# Patient Record
Sex: Male | Born: 1935 | Race: White | Hispanic: No | Marital: Married | Smoking: Former smoker
Health system: Southern US, Community
[De-identification: ages and names within clinical notes are randomized; demographics above are authoritative.]

## PROBLEM LIST (undated history)

## (undated) DIAGNOSIS — I739 Peripheral vascular disease, unspecified: Secondary | ICD-10-CM

## (undated) DIAGNOSIS — J449 Chronic obstructive pulmonary disease, unspecified: Secondary | ICD-10-CM

## (undated) DIAGNOSIS — D649 Anemia, unspecified: Secondary | ICD-10-CM

## (undated) DIAGNOSIS — E785 Hyperlipidemia, unspecified: Secondary | ICD-10-CM

## (undated) DIAGNOSIS — F419 Anxiety disorder, unspecified: Secondary | ICD-10-CM

## (undated) DIAGNOSIS — C801 Malignant (primary) neoplasm, unspecified: Secondary | ICD-10-CM

## (undated) DIAGNOSIS — I10 Essential (primary) hypertension: Secondary | ICD-10-CM

## (undated) DIAGNOSIS — IMO0002 Reserved for concepts with insufficient information to code with codable children: Secondary | ICD-10-CM

## (undated) HISTORY — DX: Essential (primary) hypertension: I10

## (undated) HISTORY — DX: Chronic obstructive pulmonary disease, unspecified: J44.9

## (undated) HISTORY — DX: Reserved for concepts with insufficient information to code with codable children: IMO0002

## (undated) HISTORY — DX: Anemia, unspecified: D64.9

## (undated) HISTORY — DX: Peripheral vascular disease, unspecified: I73.9

## (undated) HISTORY — DX: Malignant (primary) neoplasm, unspecified: C80.1

## (undated) HISTORY — DX: Anxiety disorder, unspecified: F41.9

## (undated) HISTORY — DX: Hyperlipidemia, unspecified: E78.5

---

## 2009-07-04 DIAGNOSIS — C801 Malignant (primary) neoplasm, unspecified: Secondary | ICD-10-CM

## 2009-07-04 HISTORY — DX: Malignant (primary) neoplasm, unspecified: C80.1

## 2009-10-07 ENCOUNTER — Ambulatory Visit: Payer: Self-pay | Admitting: Family Medicine

## 2009-10-07 DIAGNOSIS — J449 Chronic obstructive pulmonary disease, unspecified: Secondary | ICD-10-CM

## 2009-10-07 DIAGNOSIS — I10 Essential (primary) hypertension: Secondary | ICD-10-CM

## 2009-10-07 DIAGNOSIS — J4489 Other specified chronic obstructive pulmonary disease: Secondary | ICD-10-CM | POA: Insufficient documentation

## 2009-10-07 DIAGNOSIS — F329 Major depressive disorder, single episode, unspecified: Secondary | ICD-10-CM

## 2009-10-07 DIAGNOSIS — I739 Peripheral vascular disease, unspecified: Secondary | ICD-10-CM

## 2009-10-07 DIAGNOSIS — K279 Peptic ulcer, site unspecified, unspecified as acute or chronic, without hemorrhage or perforation: Secondary | ICD-10-CM | POA: Insufficient documentation

## 2009-10-07 DIAGNOSIS — E7800 Pure hypercholesterolemia, unspecified: Secondary | ICD-10-CM

## 2009-10-10 ENCOUNTER — Encounter: Payer: Self-pay | Admitting: Family Medicine

## 2009-10-14 ENCOUNTER — Encounter: Payer: Self-pay | Admitting: Family Medicine

## 2009-10-15 ENCOUNTER — Telehealth (INDEPENDENT_AMBULATORY_CARE_PROVIDER_SITE_OTHER): Payer: Self-pay | Admitting: *Deleted

## 2009-10-16 ENCOUNTER — Telehealth: Payer: Self-pay | Admitting: Family Medicine

## 2009-10-16 ENCOUNTER — Ambulatory Visit: Payer: Self-pay | Admitting: Family Medicine

## 2009-10-16 ENCOUNTER — Telehealth (INDEPENDENT_AMBULATORY_CARE_PROVIDER_SITE_OTHER): Payer: Self-pay | Admitting: *Deleted

## 2009-10-16 DIAGNOSIS — R0989 Other specified symptoms and signs involving the circulatory and respiratory systems: Secondary | ICD-10-CM

## 2009-10-16 DIAGNOSIS — R05 Cough: Secondary | ICD-10-CM

## 2009-10-16 DIAGNOSIS — R0609 Other forms of dyspnea: Secondary | ICD-10-CM

## 2009-10-18 ENCOUNTER — Encounter: Payer: Self-pay | Admitting: Family Medicine

## 2009-10-21 ENCOUNTER — Ambulatory Visit: Payer: Self-pay | Admitting: Family Medicine

## 2009-10-21 DIAGNOSIS — D509 Iron deficiency anemia, unspecified: Secondary | ICD-10-CM

## 2009-10-21 LAB — CONVERTED CEMR LAB: Hemoglobin: 10.9 g/dL

## 2009-10-26 ENCOUNTER — Telehealth (INDEPENDENT_AMBULATORY_CARE_PROVIDER_SITE_OTHER): Payer: Self-pay | Admitting: *Deleted

## 2009-10-27 ENCOUNTER — Ambulatory Visit: Payer: Self-pay | Admitting: Family Medicine

## 2009-11-11 ENCOUNTER — Encounter: Payer: Self-pay | Admitting: Family Medicine

## 2009-11-20 ENCOUNTER — Ambulatory Visit: Payer: Self-pay | Admitting: Family Medicine

## 2009-11-20 DIAGNOSIS — E1159 Type 2 diabetes mellitus with other circulatory complications: Secondary | ICD-10-CM | POA: Insufficient documentation

## 2009-11-20 DIAGNOSIS — Z87891 Personal history of nicotine dependence: Secondary | ICD-10-CM | POA: Insufficient documentation

## 2009-11-27 ENCOUNTER — Telehealth (INDEPENDENT_AMBULATORY_CARE_PROVIDER_SITE_OTHER): Payer: Self-pay | Admitting: *Deleted

## 2009-11-27 ENCOUNTER — Encounter: Payer: Self-pay | Admitting: Family Medicine

## 2009-11-27 ENCOUNTER — Encounter: Admission: RE | Admit: 2009-11-27 | Discharge: 2009-11-27 | Payer: Self-pay | Admitting: Family Medicine

## 2009-12-01 ENCOUNTER — Encounter: Admission: RE | Admit: 2009-12-01 | Discharge: 2009-12-01 | Payer: Self-pay | Admitting: Family Medicine

## 2009-12-01 DIAGNOSIS — C3490 Malignant neoplasm of unspecified part of unspecified bronchus or lung: Secondary | ICD-10-CM | POA: Insufficient documentation

## 2009-12-01 LAB — CONVERTED CEMR LAB: Creatinine, Ser: 1.74 mg/dL — ABNORMAL HIGH (ref 0.40–1.50)

## 2009-12-03 ENCOUNTER — Encounter: Payer: Self-pay | Admitting: Family Medicine

## 2009-12-15 ENCOUNTER — Encounter: Payer: Self-pay | Admitting: Family Medicine

## 2009-12-16 ENCOUNTER — Encounter: Payer: Self-pay | Admitting: Family Medicine

## 2009-12-23 ENCOUNTER — Ambulatory Visit: Payer: Self-pay | Admitting: Family Medicine

## 2009-12-24 ENCOUNTER — Encounter: Payer: Self-pay | Admitting: Family Medicine

## 2010-01-05 ENCOUNTER — Encounter: Payer: Self-pay | Admitting: Family Medicine

## 2010-02-01 HISTORY — PX: THORACOTOMY: SUR1349

## 2010-02-05 ENCOUNTER — Telehealth: Payer: Self-pay | Admitting: Family Medicine

## 2010-02-17 ENCOUNTER — Telehealth (INDEPENDENT_AMBULATORY_CARE_PROVIDER_SITE_OTHER): Payer: Self-pay | Admitting: *Deleted

## 2010-02-18 ENCOUNTER — Encounter: Admission: RE | Admit: 2010-02-18 | Discharge: 2010-02-18 | Payer: Self-pay | Admitting: Thoracic Diseases

## 2010-02-19 ENCOUNTER — Telehealth (INDEPENDENT_AMBULATORY_CARE_PROVIDER_SITE_OTHER): Payer: Self-pay | Admitting: *Deleted

## 2010-02-22 ENCOUNTER — Telehealth (INDEPENDENT_AMBULATORY_CARE_PROVIDER_SITE_OTHER): Payer: Self-pay | Admitting: *Deleted

## 2010-02-23 ENCOUNTER — Encounter: Payer: Self-pay | Admitting: Family Medicine

## 2010-02-24 ENCOUNTER — Encounter: Payer: Self-pay | Admitting: Family Medicine

## 2010-02-25 ENCOUNTER — Ambulatory Visit: Payer: Self-pay | Admitting: Family Medicine

## 2010-02-25 ENCOUNTER — Telehealth: Payer: Self-pay | Admitting: Family Medicine

## 2010-03-04 ENCOUNTER — Encounter: Payer: Self-pay | Admitting: Family Medicine

## 2010-03-26 ENCOUNTER — Ambulatory Visit: Payer: Self-pay | Admitting: Family Medicine

## 2010-03-26 ENCOUNTER — Telehealth: Payer: Self-pay | Admitting: Family Medicine

## 2010-04-05 ENCOUNTER — Telehealth: Payer: Self-pay | Admitting: Family Medicine

## 2010-04-08 ENCOUNTER — Telehealth: Payer: Self-pay | Admitting: Family Medicine

## 2010-04-27 ENCOUNTER — Ambulatory Visit: Payer: Self-pay | Admitting: Family Medicine

## 2010-04-27 LAB — CONVERTED CEMR LAB: Hemoglobin: 5.8 g/dL

## 2010-04-28 ENCOUNTER — Encounter: Payer: Self-pay | Admitting: Family Medicine

## 2010-05-06 ENCOUNTER — Encounter: Payer: Self-pay | Admitting: Family Medicine

## 2010-05-14 ENCOUNTER — Ambulatory Visit: Payer: Self-pay | Admitting: Family Medicine

## 2010-05-18 ENCOUNTER — Telehealth: Payer: Self-pay | Admitting: Family Medicine

## 2010-05-24 ENCOUNTER — Encounter: Payer: Self-pay | Admitting: Family Medicine

## 2010-06-03 ENCOUNTER — Telehealth (INDEPENDENT_AMBULATORY_CARE_PROVIDER_SITE_OTHER): Payer: Self-pay | Admitting: *Deleted

## 2010-06-04 ENCOUNTER — Encounter: Payer: Self-pay | Admitting: Family Medicine

## 2010-06-10 ENCOUNTER — Telehealth: Payer: Self-pay | Admitting: Family Medicine

## 2010-06-18 ENCOUNTER — Encounter: Payer: Self-pay | Admitting: Family Medicine

## 2010-07-15 ENCOUNTER — Encounter: Payer: Self-pay | Admitting: Family Medicine

## 2010-07-29 ENCOUNTER — Encounter: Payer: Self-pay | Admitting: Family Medicine

## 2010-08-03 NOTE — Progress Notes (Signed)
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 He will need an appt with [facility] to follow.  Initial call taken by: Seymour Bars DO,  October 26, 2009 1:30 PM  Follow-up for Phone Call        Pt aware Follow-up by: Payton Spark CMA,  October 27, 2009 8:25 AM

## 2010-08-03 NOTE — Assessment & Plan Note (Signed)
Summary: f/u mood/ COPD/ lung CA   Vital Signs:  Patient profile:   75 year old male Height:      69 inches Weight:      140 pounds BMI:     20.75 O2 Sat:      95 % on Room air Pulse rate:   96 / minute BP sitting:   124 / 57  (left arm) Cuff size:   regular  Vitals Entered By: Payton Spark CMA (March 26, 2010 8:33 AM)  O2 Flow:  Room air CC: F/U.   Primary Care Provider:  Seymour Bars DO  CC:  F/U.Marland Kitchen  History of Present Illness: 74 yo WM presents for f/u visit.  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  He developed complete R lung atx and was seen by Dr Eulah Pont in the hospital and had a bronchoscopy done.  He is wearing oxygen at night.  Continues to have low energy level and some chest contgestion.  Denies wt loss, anorexia, fevers or nightsweats.  He is using duonebs as needed.   He is taking Amitripytline at night but still not sleeping well.  Anxiety has improved but is still needing Xanax almost eveyrday.    Current Medications (verified): 1)  Simvastatin 20 Mg Tabs (Simvastatin) .... Take 1 Tab By Mouth At Bedtime 2)  Omeprazole 20 Mg Cpdr (Omeprazole) .... Take 1 Tab By Mouth Once Daily 3)  Amlodipine Besylate 10 Mg Tabs (Amlodipine Besylate) .... Take 1 Tab By Mouth Once Daily 4)  Doxazosin Mesylate 4 Mg Tabs (Doxazosin Mesylate) .... Take 2 Tabs By Mouth Once Daily 5)  Hydrochlorothiazide 25 Mg Tabs (Hydrochlorothiazide) .... Take 1/2  Tab By Mouth Once Daily 6)  Amitriptyline Hcl 75 Mg Tabs (Amitriptyline Hcl) .... Take 1 Tablet By Mouth Once A Day At Bedtime 7)  Alprazolam 0.5 Mg Tabs (Alprazolam) .... Take 1 Tablet By Mouth Two Times A Day 8)  Proair Hfa 108 (90 Base) Mcg/act Aers (Albuterol Sulfate) .... 2 Puffs 4 X A Day As Needed For Shortness of Breath 9)  Freestyle Test  Strp (Glucose Blood) .... Use As Directed To Test Blood Sugar Once Daily 10)  Humidified Oxygen Via Nasal  [place] .Marland Kitchen.. 2l/min Dx; Copd With Nocturnal Hypoxemia 11)  Nebulizer Machine .... Dx: Copd Send To [facility] 12)  Duoneb 0.5-2.5 (3) Mg/51ml Soln (Ipratropium-Albuterol) .... 3 Ml Nebs 4 X A Day  Allergies (verified): No Known Drug Allergies  Past History:  Past Medical History: anxiety HTN DM High cholesterol  PUD COPD, on home O2 severe iron def anemia PAD- [facility] Stage IA RUL squamous cell carcinoma  GI Dr Jason Fila CVTS Dr Lequita Halt pulm Dr Ether Griffins  Past Surgical History: EGD 2010/ colonoscopy 2010 --> mutiple small gastric ulcers R thoracotomy/ RUL resection for lung cancer 02-2010  Family History: Reviewed history from 10/07/2009 and no changes required. mother died of abdomina aneursym father died cancer brother died lung cancer 2 sisters healthy  Social History: Reviewed history from 10/07/2009 and no changes required. Retired from [employer]. Married.  Has 3 daughters all local. Smokes 1 ppd Walks some. Wt stable. Denies ETOH.  Review of Systems      See HPI  Physical Exam  General:  alert, well-developed, well-nourished, and well-hydrated.   Head:  normocephalic and atraumatic.   Eyes:  conjunctiva clear Mouth:  pharynx pink and moist.   Neck:  no masses.  Chest Wall:  healed surgical scar R chest wall Lungs:  nonlabored breathing with bibasilar wheezing and mild rhonchi.  prolonged exp phase.  barrel chested Heart:  RRR w/o audible murmurs Abdomen:  soft, non-tender, normal bowel sounds, no distention, and no masses.   Skin:  color normal.   Psych:  good eye contact, not anxious appearing, and not depressed appearing.     Impression & Recommendations:  Problem # 1:  MALIGNANT NEOPLASM BRONCHUS&LUNG UNSPEC SITE (ICD-162.9) Reviewed records.  He had RUL resection of stage IA squamous cell carcinoma and is being followed by Dr Lequita Halt and Dr Ether Griffins.  It apperas that he is not needing to do any further treament at this time.    His spirits have improved  but he is still needing xanax (day ) and restoril at night.  Problem # 2:  DEPRESSION (ICD-311) Mood has improved on current meds.  Continue.  His updated medication list for this problem includes:    Amitriptyline Hcl 75 Mg Tabs (Amitriptyline hcl) .Marland Kitchen... Take 1 tablet by mouth once a day at bedtime    Alprazolam 0.5 Mg Tabs (Alprazolam) .Marland Kitchen... 1/2 to 1 tab by mouth daily as needed for anxiety  Problem # 3:  COPD (ICD-496) Following with Dr Ether Griffins.  On meds.  Declined flu vaccine today.  Still working on smoking cessation.  On nocturnal O2 His updated medication list for this problem includes:    Proair Hfa 108 (90 Base) Mcg/act Aers (Albuterol sulfate) .Marland Kitchen... 2 puffs 4 x a day as needed for shortness of breath    Duoneb 0.5-2.5 (3) Mg/20ml Soln (Ipratropium-albuterol) .Marland KitchenMarland KitchenMarland KitchenMarland Kitchen 3 ml nebs 4 x a day  Problem # 4:  ESSENTIAL HYPERTENSION, BENIGN (ICD-401.1) Stop HCTZ due to dry mucous membranes and elevated CR.  .  Will continue other meds.  F/U in 2 mos and will update labs then.   The following medications were removed from the medication list:    Hydrochlorothiazide 25 Mg Tabs (Hydrochlorothiazide) .Marland Kitchen... Take 1/2  tab by mouth once daily His updated medication list for this problem includes:    Amlodipine Besylate 10 Mg Tabs (Amlodipine besylate) .Marland Kitchen... Take 1 tab by mouth once daily    Doxazosin Mesylate 4 Mg Tabs (Doxazosin mesylate) .Marland Kitchen... Take 2 tabs by mouth once daily  BP today: 124/57 Prior BP: 130/60 (02/25/2010)  Labs Reviewed: Creat: : 1.74 (11/27/2009)     Complete Medication List: 1)  Simvastatin 20 Mg Tabs (Simvastatin) .... Take 1 tab by mouth at bedtime 2)  Omeprazole 20 Mg Cpdr (Omeprazole) .... Take 1 tab by mouth once daily 3)  Amlodipine Besylate 10 Mg Tabs (Amlodipine besylate) .... Take 1 tab by mouth once daily 4)  Doxazosin Mesylate 4 Mg Tabs (Doxazosin mesylate) .... Take 2 tabs by mouth once daily 5)  Amitriptyline Hcl 75 Mg Tabs (Amitriptyline hcl) .... Take 1  tablet by mouth once a day at bedtime 6)  Restoril 15 Mg Caps (Temazepam) .Marland Kitchen.. 1 capsule by mouth at bedtime as needed sleep 7)  Proair Hfa 108 (90 Base) Mcg/act Aers (Albuterol sulfate) .... 2 puffs 4 x a day as needed for shortness of breath 8)  Freestyle Test Strp (Glucose blood) .... Use as directed to test blood sugar once daily 9)  Humidified Oxygen Via Nasal [place]  .Marland Kitchen.. 2l/min dx; copd with nocturnal hypoxemia 10)  Nebulizer Machine  .... Dx: copd send to [facility] 11)  Duoneb 0.5-2.5 (3) Mg/70ml Soln (Ipratropium-albuterol) .... 3 ml nebs 4 x a day  12)  Alprazolam 0.5 Mg Tabs (Alprazolam) .... 1/2 to 1 tab by mouth daily as needed for anxiety  Patient Instructions: 1)  Stop HCTZ. 2)  Will get [facility] to come out and check your Oxygen and Nebulized medicine at home. 3)  Will change Alprazolam to Restoril for sleep at nighttime. 4)  Stay on Amitriptyline at night. 5)  Recommend flu shot in October. 6)  rEturn for f/u with labs in 2 mos. Prescriptions: ALPRAZOLAM 0.5 MG TABS (ALPRAZOLAM) 1/2 to 1 tab by mouth daily as needed for anxiety  #30 x 0   Entered and Authorized by:   Seymour Bars DO   Signed by:   Seymour Bars DO on 03/26/2010   Method used:   Printed then faxed to ...       [facility] (retail)       [address]       [place], [place]  [address]       Ph: [phone]       Fax: [phone]   RxID:   [id number] RESTORIL 15 MG CAPS (TEMAZEPAM) 1 capsule by mouth at bedtime as needed sleep  #30 x 0   Entered and Authorized by:   Seymour Bars DO   Signed by:   Seymour Bars DO on 03/26/2010   Method used:   Printed then faxed to ...       [facility] (retail)       [address]       [place], [place]  [address]       Ph: [phone]       Fax: [phone]   RxID:   [id number]

## 2010-08-03 NOTE — Letter (Signed)
Summary: [facility]  [facility]   Imported By: Lanelle Bal 05/07/2010 10:37:57  _____________________________________________________________________  External Attachment:    Type:   Image     Comment:   External Document

## 2010-08-03 NOTE — Progress Notes (Signed)
Summary: HH  Phone Note Call from Patient Call back at [phone]   Caller: Patient Call For: Zachary Bars DO Summary of Call: Pt called and wanted update on [facility] nurse coming out because no one has came out yet and what to do about his soreness Initial call taken by: Kathlene November LPN,  April 08, 2010 8:09 AM  Follow-up for Phone Call        Dr. Cathey Endow, I will check on home health. What do you advise for Pt's chest soreness.   Is he talking about incisional soreness? Follow-up by: Payton Spark CMA,  April 08, 2010 8:15 AM  Additional Follow-up for Phone Call Additional follow up Details #1::        Ref # [id number] for [facility]. Someone from local office will call back shortly. Additional Follow-up by: Payton Spark CMA,  April 08, 2010 8:47 AM    Additional Follow-up for Phone Call Additional follow up Details #2::    Pt states he has been sore where his incision is and also in his shoulder. Pt states that these areas have been sore since the surgery.  Follow-up by: Payton Spark CMA,  April 08, 2010 4:51 PM  Additional Follow-up for Phone Call Additional follow up Details #3:: Details for Additional Follow-up Action Taken: does he have anything for pain?  Christoper Allegra went out to home on 04/07/10. Pt's machines and supplies are setup. I also called [facility] to verify pain med Rx. Pt has only filled Norco 5-325 Take 1 tab by mouth q 4-6 hours as needed #50 x 0 in August.  Additional Follow-up by: Zachary Bars DO,  April 08, 2010 4:54 PM  New/Updated Medications: HYDROCODONE-ACETAMINOPHEN 5-325 MG TABS (HYDROCODONE-ACETAMINOPHEN) 1-2 tabs by mouth three times a day as needed severe pain Prescriptions: HYDROCODONE-ACETAMINOPHEN 5-325 MG TABS (HYDROCODONE-ACETAMINOPHEN) 1-2 tabs by mouth three times a day as needed severe pain  #40 x 0   Entered and Authorized by:   Zachary Bars DO   Signed by:   Zachary Bars DO on 04/09/2010   Method used:   Printed then faxed to ...       [facility]  (retail)       [address]       [place], [place]  [address]       Ph: [phone]       Fax: [phone]   RxID:   [id number]   Appended Document: HH Pt aware of the above.

## 2010-08-03 NOTE — Progress Notes (Signed)
Summary: No BM  Phone Note From Other Clinic   Initial call taken by: Payton Spark CMA,  February 19, 2010 11:18 AM Caller: Joni Reining [facility] Summary of Call: Joni Reining visited Pt today and reports that Pt has still not had BM. Pt is not uncomfortable and has positive bowel sounds. Pt is taking Miralax two times a day. Joni Reining plans to visit Pt again next week unless you have other plans. Please advise. Initial call taken by: Payton Spark CMA,  February 19, 2010 11:19 AM  Follow-up for Phone Call        Can Joni Reining add Colace 2 x a day if he is not already on a stool softener and ensure that his is getting adequate intake of fluids.  Call back with a report on Monday.  Thanks. Follow-up by: Seymour Bars DO,  February 19, 2010 11:36 AM  Additional Follow-up for Phone Call Additional follow up Details #1::        [facility] for Joni Reining to [facility] Additional Follow-up by: Payton Spark CMA,  February 19, 2010 11:43 AM    Additional Follow-up for Phone Call Additional follow up Details #2::    Joni Reining agrees to the above Follow-up by: Payton Spark CMA,  February 19, 2010 12:10 PM

## 2010-08-03 NOTE — Progress Notes (Signed)
Summary: HH  Phone Note Call from Patient Call back at Home Phone [phone] Call back at [phone]   Caller: Sister- Kathie Rhodes Call For: Zachary Bars DO Summary of Call: Called and states you told him you were gonna get a home health nurse to come to his home and he has not heard anything about it or had anyone come out. Also wants to know if he can get something for soreness where he had surgery done to remove part of his lung 2 months ago. Initial call taken by: Kathlene November LPN,  April 05, 2010 3:47 PM  Follow-up for Phone Call        Pt was DC'd from [facility] and will need new referral. We can have [facility] go see him. If you add order, I will fax. Follow-up by: Payton Spark CMA,  April 05, 2010 4:06 PM     Appended Document: [facility] order faxed to [facility]

## 2010-08-03 NOTE — Progress Notes (Signed)
Summary: [facility]: start O2  Phone Note Outgoing Call   Summary of Call: Pls call [facility] back.   It looks like he qualifies for nocturnal oxygen.  I will write RX but they will have to send me the form to complete. Initial call taken by: Seymour Bars DO,  October 15, 2009 12:21 PM  Follow-up for Phone Call        Rx faxed Follow-up by: Payton Spark CMA,  October 15, 2009 4:15 PM    New/Updated Medications: * HUMIDIFIED OXYGEN VIA NASAL CANULA 2L/MIN dx; COPD with nocturnal hypoxemia Prescriptions: HUMIDIFIED OXYGEN VIA NASAL CANULA 2L/MIN dx; COPD with nocturnal hypoxemia  #1 mo x 12   Entered and Authorized by:   Seymour Bars DO   Signed by:   Seymour Bars DO on 10/15/2009   Method used:   Printed then faxed to ...       [facility] (retail)       [address]       [place], [place]  [address]       Ph: [phone]       Fax: [phone]   RxID:   [id number]

## 2010-08-03 NOTE — Consult Note (Signed)
Summary: [facility]  [facility]   Imported By: Lanelle Bal 11/26/2009 13:06:29  _____________________________________________________________________  External Attachment:    Type:   Image     Comment:   External Document

## 2010-08-03 NOTE — Progress Notes (Signed)
Summary: [facility]  Phone Note From Other Clinic   Caller: Patient Initial call taken by: Payton Spark CMA,  February 17, 2010 12:41 PM Caller: [facility]  Summary of Call: Cherri from [facility] visited Pt today. Pt has not had BM in 6-8 days. Pt states this is not uncommon but Cherri advised Pt to restart Miralax, increase fiber and drink plenty of fluids. Cherri will go back to see Pt on this Fri and report back to [place] on BM. Pt also not wearing O2 all day so Cherri advised Pt that he should be on O2 all day. Pt agreed. Cherri will fax any orders that need to be signed.  Initial call taken by: Payton Spark CMA,  February 17, 2010 12:46 PM  Follow-up for Phone Call        ok, sounds like a good plan. Follow-up by: Seymour Bars DO,  February 17, 2010 1:28 PM

## 2010-08-03 NOTE — Assessment & Plan Note (Signed)
Summary: NOV COPD/ claudication   Vital Signs:  Patient profile:   75 year old male Height:      69 inches Weight:      153 pounds BMI:     22.68 O2 Sat:      97 % on Room air Temp:     98.7 degrees F oral Pulse rate:   93 / minute BP sitting:   111 / 50  (left arm) Cuff size:   regular  Vitals Entered By: Payton Spark CMA (October 07, 2009 9:44 AM)  O2 Flow:  Room air CC: New to est. C/o DOE and R glut pain w/ exertion.    Primary Care Provider:  Seymour Bars DO  CC:  New to est. C/o DOE and R glut pain w/ exertion. Marland Kitchen  History of Present Illness: 75 yo WM presents for NOV.    Due to insurance change, he had to change practices.  He has T2DM, HTN, high cholesterol, anxiety.  He is doing well with his medicines.    He is a long time smoker who has not been tested for COPD but c/o feeling more SOB w/ exertion.  Having to rest more and this bothers him b/c he tries to stay active.  He denies chest tightness.  Has some mild chronic cough.  He also has R buttock pain after walking for about 3 min.  It improves once he rests.  This started a few wks ago.  He has no tingling, numbness, pain pain or radiation of the pain.    Current Medications (verified): 1)  Metformin Hcl 500 Mg Tabs (Metformin Hcl) .... Take 1 Tab By Mouth Two Times A Day 2)  Simvastatin 20 Mg Tabs (Simvastatin) .... Take 1 Tab By Mouth At Bedtime 3)  Omeprazole 20 Mg Cpdr (Omeprazole) .... Take 1 Tab By Mouth Once Daily 4)  Amlodipine Besylate 10 Mg Tabs (Amlodipine Besylate) .... Take 1 Tab By Mouth Once Daily 5)  Doxazosin Mesylate 4 Mg Tabs (Doxazosin Mesylate) .... Take 2 Tabs By Mouth Once Daily 6)  Hydrochlorothiazide 25 Mg Tabs (Hydrochlorothiazide) .... Take 1 Tab By Mouth Once Daily 7)  Amitriptyline Hcl 25 Mg Tabs (Amitriptyline Hcl) .... Take 2 Tabs By Mouth At Bedtime 8)  Alprazolam 0.25 Mg Tabs (Alprazolam) .... Take 1 Tab By Mouth Once Daily As Needed  Allergies (verified): No Known Drug  Allergies  Past History:  Past Medical History: anxiety HTN DM High cholesterol  PUD  Past Surgical History: EGD 2010/ colonoscopy 11/25/2008  Family History: mother died of abdomina aneursym father died cancer brother died lung cancer 2 sisters healthy  Social History: Retired from [employer] certified [employer]. Married.  Has 3 daughters all local. Smokes 1 ppd Walks some. Wt stable. Denies ETOH.  Review of Systems       no fevers/sweats/weakness, unexplained wt loss/gain, no change in vision, no difficulty hearing, ringing in ears, no hay fever/allergies, no CP/discomfort, no palpitations, no breast lump/nipple discharge, + cough/wheeze, no blood in stool, no N/V/D, no nocturia, no leaking urine, no unusual vag bleeding, no vaginal/penile discharge, + muscle/joint pain, no rash, no new/changing mole, no HA, no memory loss, + anxiety, + sleep problem, no depression, no unexplained lumps, no easy bruising/bleeding, no concern with sexual function   Physical Exam  General:  elderly WM in NAD, hard of hearing Head:  normocephalic, atraumatic, and male-pattern balding.   Eyes:  clouding of the anterior chambers Ears:  no external deformities.   Nose:  no nasal discharge.   Mouth:  pharynx pink and moist and teeth missing.   Neck:  no masses.  no audible carotid bruits Chest Wall:  barrel chested Lungs:  prolonge exp phase, mildly labored with ambulation.  audible wheezing.  no rhonchi or crackles Heart:  RRR w/ no audible murmurs Abdomen:  no AA bruits, soft.  NABS.  No HSM R> L femoral bruit, harsh Pulses:  1+ pedal pulses Extremities:  no LE edema thickened angulated great toenails Skin:  color normal.   Cervical Nodes:  No lymphadenopathy noted Psych:  good eye contact, not anxious appearing, and not depressed appearing.     Impression & Recommendations:  Problem # 1:  CLAUDICATION, INTERMITTENT (ICD-443.9) R femoral bruit with R gluteal claudication.  PAD screening ordered.  Risk  factors include smoking, DM, high chol and HTN.   Orders: T-Arterial w/Exercise ([address])  Problem # 2:  COPD (ICD-496) He clearly has COPD but has not had PFTS done-- set up in 2 wks.  PFs in red zone so ProAir sample given to start 3-4 x a day.  Instructed on use.  Plan to set up DuoNebs and may need home O2 -- will get [facility] to go out and do any oxygen check.  Encouraged smoking cessation.  He had AMS from [facility]. His updated medication list for this problem includes:    Proair Hfa 108 (90 Base) Mcg/act Aers (Albuterol sulfate) .Marland Kitchen... 2 puffs 4 x a day as needed for shortness of breath  Orders: Home Health Referral (Home Health) Peak Flow Rate ([address])  Problem # 3:  ESSENTIAL HYPERTENSION, BENIGN (ICD-401.1) DBP a bit low today.  Plan to cut his HCTZ in 1/2 and f/u reading at 2 wk f/u visit. His updated medication list for this problem includes:    Amlodipine Besylate 10 Mg Tabs (Amlodipine besylate) .Marland Kitchen... Take 1 tab by mouth once daily    Doxazosin Mesylate 4 Mg Tabs (Doxazosin mesylate) .Marland Kitchen... Take 2 tabs by mouth once daily    Hydrochlorothiazide 25 Mg Tabs (Hydrochlorothiazide) .Marland Kitchen... Take 1/2  tab by mouth once daily  BP today: 111/50  Problem # 4:  HYPERCHOLESTEROLEMIA (ICD-272.0) Obtain old records for labs. His updated medication list for this problem includes:    Simvastatin 20 Mg Tabs (Simvastatin) .Marland Kitchen... Take 1 tab by mouth at bedtime  Complete Medication List: 1)  Metformin Hcl 500 Mg Tabs (Metformin hcl) .... Take 1 tab by mouth two times a day 2)  Simvastatin 20 Mg Tabs (Simvastatin) .... Take 1 tab by mouth at bedtime 3)  Omeprazole 20 Mg Cpdr (Omeprazole) .... Take 1 tab by mouth once daily 4)  Amlodipine Besylate 10 Mg Tabs (Amlodipine besylate) .... Take 1 tab by mouth once daily 5)  Doxazosin Mesylate 4 Mg Tabs (Doxazosin mesylate) .... Take 2 tabs by mouth once daily 6)  Hydrochlorothiazide 25 Mg Tabs (Hydrochlorothiazide) .... Take 1/2  tab by mouth once daily 7)   Amitriptyline Hcl 25 Mg Tabs (Amitriptyline hcl) .... Take 2 tabs by mouth at bedtime 8)  Alprazolam 0.25 Mg Tabs (Alprazolam) .... Take 1 tab by mouth once daily as needed 9)  Proair Hfa 108 (90 Base) Mcg/act Aers (Albuterol sulfate) .... 2 puffs 4 x a day as needed for shortness of breath 10)  Freestyle Test Strp (Glucose blood) .... Use as directed to test blood sugar once daily  Patient Instructions: 1)  Will set you up for arterial testing.   2)  Will set you up for  Oxygen testing -- Christoper Allegra will come out to your house to check this. 3)  Start on ProAir inhaler 2 puffs 3-4 x a day for shortness of breath. 4)  Cut back on smoking. 5)  Return for f/u with fasting labs in 6 wks.

## 2010-08-03 NOTE — Progress Notes (Signed)
Summary: Restoril refill  Phone Note Call from Patient   Caller: [facility] Call For: [facility] Summary of Call: Out of Restoril needs a refill sent to [facility] Initial call taken by: Kathlene November LPN,  June 03, 2010 10:49 AM  Follow-up for Phone Call        already sent yest Follow-up by: Payton Spark CMA,  June 03, 2010 11:50 AM

## 2010-08-03 NOTE — Progress Notes (Signed)
Summary: alprazolam RFd  Phone Note Call from Patient   Caller: Sister Summary of Call: Pt's sister [facility] stating Pt had surgery and is now very anxious and his "nerves are bad". Pt asked sister to call and request soemthing to help him relax and be less nervous. Please advise. Initial call taken by: Payton Spark CMA,  February 05, 2010 8:51 AM  Follow-up for Phone Call        have him schedule a f/u with me in 3-4 wks. Follow-up by: Seymour Bars DO,  February 05, 2010 8:54 AM    New/Updated Medications: ALPRAZOLAM 0.25 MG TABS (ALPRAZOLAM) 1 tab by mouth two times a day as needed anxiety Prescriptions: ALPRAZOLAM 0.25 MG TABS (ALPRAZOLAM) 1 tab by mouth two times a day as needed anxiety  #40 x 0   Entered and Authorized by:   Seymour Bars DO   Signed by:   Seymour Bars DO on 02/05/2010   Method used:   Printed then faxed to ...       [facility] (retail)       [address]       [place], [place]  [address]       Ph: [phone]       Fax: [phone]   RxID:   [id number]   Appended Document: alprazolam RFd Rx faxed

## 2010-08-03 NOTE — Progress Notes (Signed)
Summary: Christoper Allegra needs some info on this patient for his oxygen  Phone Note From Other Clinic   Caller: Nurse Summary of Call: Gearldine Bienenstock from [place] called and states that [facility] needs ov notes saying why the Dr. is ordering oxygen.....  Gearldine Bienenstock- Fax [phone] &  phone [phone]... Thank you, Christel Mormon Initial call taken by: Michaelle Copas,  October 16, 2009 9:12 AM  Follow-up for Phone Call        Pls send overnight O2 report and last OV note to [facility] Follow-up by: Seymour Bars DO,  October 16, 2009 9:24 AM

## 2010-08-03 NOTE — Progress Notes (Signed)
Summary: Return call  Phone Note Call from Patient   Caller: Patient Summary of Call: Pt LMOM x 2 asking for a call from Dr. Cathey Endow. Please return call @ [phone] Initial call taken by: Payton Spark CMA,  February 25, 2010 9:19 AM  Follow-up for Phone Call        I called back and LMOM. Follow-up by: Seymour Bars DO,  February 25, 2010 12:47 PM

## 2010-08-03 NOTE — Letter (Signed)
Summary: DME Request for Oxygen Concentrator/[employer]  DME Request for Oxygen Concentrator/[employer]   Imported By: Lanelle Bal 03/10/2010 10:27:37  _____________________________________________________________________  External Attachment:    Type:   Image     Comment:   External Document

## 2010-08-03 NOTE — Procedures (Signed)
Summary: Oximetry/Respiratory Diagnostics  Oximetry/Respiratory Diagnostics   Imported By: Lanelle Bal 10/22/2009 13:09:17  _____________________________________________________________________  External Attachment:    Type:   Image     Comment:   External Document

## 2010-08-03 NOTE — Progress Notes (Signed)
Summary: Daughter requests call  Phone Note Call from Patient   Caller: Daughter Summary of Call: Berna Spare (daughter) requests call back to discuss father. Please call her at [phone] Initial call taken by: Payton Spark CMA,  June 10, 2010 8:15 AM  Follow-up for Phone Call        I called and [facility] that I was returning her call. Follow-up by: Seymour Bars DO,  June 11, 2010 12:41 PM  Additional Follow-up for Phone Call Additional follow up Details #1::        I tried to answer her ?s but explained that her dad had not been seen in the past month and that she may want to call the hospice nurses directly. Additional Follow-up by: Seymour Bars DO,  June 11, 2010 1:51 PM

## 2010-08-03 NOTE — Assessment & Plan Note (Signed)
Summary: HFU iron def anemia   Vital Signs:  Patient profile:   75 year old male Height:      69 inches Weight:      152 pounds BMI:     22.53 O2 Sat:      95 % on Room air Pulse rate:   91 / minute BP sitting:   135 / 61  (left arm) Cuff size:   regular  Vitals Entered By: Payton Spark CMA (October 21, 2009 8:56 AM)  O2 Flow:  Room air CC: Hosp F/U.    Primary Care Provider:  Seymour Bars DO  CC:  Hosp F/U. Marland Kitchen  History of Present Illness: 75 yo WM presents for f/u visit.  He was seen last wk and had a Hgb of 4.2.  He was symptomatic with dyspnea.  He was admitted to [facility] and recieved 4 units of blood and IV iron to boost his Hgb up to 10.  he is feeling much better now.  He was discharged home on oral iron 2 pills/ day.  (Nu-Iron 150 mg two times a day).  He has hx of multiple gastric ulcer, last scoped about a year ago but he cannot remember who his GI doctor is.  He denies any abd pain now.  He denies any melena or hematochezia.  His appetite is good.  Denies any nausea or vomitting.    Christoper Allegra has come out to set up home nebs.  He is finishing out the last few days of Doxycycline for COPD exacerbation.  He is using DuoNeb 2-3 x a day and nocturnal O2 which he thinks is really helping his breathing and exercise capacity.  He is due for a repeat CXR in 4-6 wks after findings of a small opacity in the RUL, possible atx vs focal infiltrate.    Current Medications (verified): 1)  Metformin Hcl 500 Mg Tabs (Metformin Hcl) .... Take 1 Tab By Mouth Two Times A Day 2)  Simvastatin 20 Mg Tabs (Simvastatin) .... Take 1 Tab By Mouth At Bedtime 3)  Omeprazole 20 Mg Cpdr (Omeprazole) .... Take 1 Tab By Mouth Once Daily 4)  Amlodipine Besylate 10 Mg Tabs (Amlodipine Besylate) .... Take 1 Tab By Mouth Once Daily 5)  Doxazosin Mesylate 4 Mg Tabs (Doxazosin Mesylate) .... Take 2 Tabs By Mouth Once Daily 6)  Hydrochlorothiazide 25 Mg Tabs (Hydrochlorothiazide) .... Take 1/2  Tab By  Mouth Once Daily 7)  Amitriptyline Hcl 25 Mg Tabs (Amitriptyline Hcl) .... Take 2 Tabs By Mouth At Bedtime 8)  Alprazolam 0.25 Mg Tabs (Alprazolam) .... Take 1 Tab By Mouth Once Daily As Needed 9)  Proair Hfa 108 (90 Base) Mcg/act Aers (Albuterol Sulfate) .... 2 Puffs 4 X A Day As Needed For Shortness of Breath 10)  Freestyle Test  Strp (Glucose Blood) .... Use As Directed To Test Blood Sugar Once Daily 11)  Humidified Oxygen Via Nasal Canula .Marland Kitchen.. 2l/min Dx; Copd With Nocturnal Hypoxemia 12)  Nebulizer Machine .... Dx: Copd Send To [facility] 13)  Duoneb 0.5-2.5 (3) Mg/29ml Soln (Ipratropium-Albuterol) .... 3 Ml Nebs 4 X A Day 14)  Doxycycline Hyclate 100 Mg Tabs (Doxycycline Hyclate) .Marland Kitchen.. 1 Tab By Mouth Two Times A Day X 10 Days  Allergies (verified): No Known Drug Allergies  Past History:  Past Medical History: anxiety HTN DM High cholesterol  PUD COPD, on home O2 severe iron def anemia  Past Surgical History: EGD 2010/ colonoscopy 2010 --> mutiple small gastric ulcers  Family  History: Reviewed history from 10/07/2009 and no changes required. mother died of abdomina aneursym father died cancer brother died lung cancer 2 sisters healthy  Social History: Reviewed history from 10/07/2009 and no changes required. Retired from [employer]. Married.  Has 3 daughters all local. Smokes 1 ppd Walks some. Wt stable. Denies ETOH.  Review of Systems General:  Denies fatigue, fever, and loss of appetite. CV:  Denies chest pain or discomfort, palpitations, shortness of breath with exertion, and swelling of feet. Resp:  Complains of cough, shortness of breath, and wheezing; denies sputum productive. GI:  Denies abdominal pain, bloody stools, constipation, dark tarry stools, diarrhea, nausea, and vomiting.  Physical Exam  General:  alert, well-developed, well-nourished, and well-hydrated.   Head:  normocephalic and atraumatic.   Eyes:  pupils equal, pupils round, and pupils  reactive to light.   Nose:  no nasal discharge.   Mouth:  mildly dry o/p with brown discoloration on lips and tongue from tobacco Neck:  no masses.   Lungs:  nonlabored breathing with bibasilar wheezing and mild rhonchi.  prolonged exp phase.  barrel chested Heart:  RRR w/o audible murmurs Abdomen:  mildly distended with stool.  soft, non-tender, and no guarding.   Pulses:  2+ radial pulses Extremities:  no LE edema Skin:  color normal.  much improved! Cervical Nodes:  No lymphadenopathy noted Psych:  good eye contact, not anxious appearing, and not depressed appearing.     Impression & Recommendations:  Problem # 1:  ANEMIA, IRON DEFICIENCY (ICD-280.9)  Severe.  S/P 4 units pRBC transfusion at [facility] last wk along with IV iron, now on oral iron.  Hgb improved from 4.2--> 10.9 today.  Symptomatically he is much improved.   Cause likely to be his multiple gastric ulcers which he had EGD for last year.  He is on Omeprazole daily and denies any symptoms today.    Continue N Iron 150 mg two times a day along with a bowel regimen for constipating SEs and Repeat labs in 4 wks.  Orders: Fingerstick (36416) Hgb (36644)  Problem # 2:  COPD (ICD-496) Completing treatment for COPD exacerbation  with last few days of Doxycyline.  He is now set up thru [facility] for nocturnal O2 and DuoNeb 3-4 x a day.  Plan to get PFTs with Dr Delford Field this summer.  He was counseled on smoking cessation but is not ready to quit.  He will be due for a CXR in 4 wks for a RUL spot.    His updated medication list for this problem includes:    Proair Hfa 108 (90 Base) Mcg/act Aers (Albuterol sulfate) .Marland Kitchen... 2 puffs 4 x a day as needed for shortness of breath    Duoneb 0.5-2.5 (3) Mg/39ml Soln (Ipratropium-albuterol) .Marland KitchenMarland KitchenMarland KitchenMarland Kitchen 3 ml nebs 4 x a day  Complete Medication List: 1)  Metformin Hcl 500 Mg Tabs (Metformin hcl) .... Take 1 tab by mouth two times a day 2)  Simvastatin 20 Mg Tabs (Simvastatin) ....  Take 1 tab by mouth at bedtime 3)  Omeprazole 20 Mg Cpdr (Omeprazole) .... Take 1 tab by mouth once daily 4)  Amlodipine Besylate 10 Mg Tabs (Amlodipine besylate) .... Take 1 tab by mouth once daily 5)  Doxazosin Mesylate 4 Mg Tabs (Doxazosin mesylate) .... Take 2 tabs by mouth once daily 6)  Hydrochlorothiazide 25 Mg Tabs (Hydrochlorothiazide) .... Take 1/2  tab by mouth once daily 7)  Amitriptyline Hcl 25 Mg Tabs (Amitriptyline hcl) .... Take 2  tabs by mouth at bedtime 8)  Alprazolam 0.25 Mg Tabs (Alprazolam) .... Take 1 tab by mouth once daily as needed 9)  Proair Hfa 108 (90 Base) Mcg/act Aers (Albuterol sulfate) .... 2 puffs 4 x a day as needed for shortness of breath 10)  Freestyle Test Strp (Glucose blood) .... Use as directed to test blood sugar once daily 11)  Humidified Oxygen Via Nasal Canula  .Marland Kitchen.. 2l/min dx; copd with nocturnal hypoxemia 12)  Nebulizer Machine  .... Dx: copd send to [facility] 13)  Duoneb 0.5-2.5 (3) Mg/66ml Soln (Ipratropium-albuterol) .... 3 ml nebs 4 x a day 14)  Doxycycline Hyclate 100 Mg Tabs (Doxycycline hyclate) .Marland Kitchen.. 1 tab by mouth two times a day x 10 days 15)  Fenofibrate 160 Mg Tabs (Fenofibrate) .Marland Kitchen.. 1 tab by mouth daily  Other Orders: Prescription Created Electronically [id number])  Patient Instructions: 1)  Return for f/u COPD/ iron deficiency anemia in 4 wks. 2)  At this visit, you will be due for a repeat CXR and labs (recheck iron levels). 3)  Continue current meds. 4)  Finish out the last few days of Doxycycline. 5)  Use Oxygen at night. 6)  Use DuoNebs 3 x a day. 7)  Avoid smoking. Prescriptions: FENOFIBRATE 160 MG TABS (FENOFIBRATE) 1 tab by mouth daily  #90 x 1   Entered and Authorized by:   Seymour Bars DO   Signed by:   Seymour Bars DO on 10/21/2009   Method used:   Faxed to ...       [facility] (mail-order)             , [place]         Ph: [phone]       Fax: [phone]   RxID:   [id number]   Laboratory Results   CBC     HGB:  10.9 g/dL   (Normal Range: 84.1-32.4 in Males, 12.0-15.0 in Females)

## 2010-08-03 NOTE — Progress Notes (Signed)
Summary: Xanax Rx  Phone Note Call from Patient   Caller: Sister Summary of Call: Pt would like to keep xanax Rx for daytime use when needed. Please advise. Initial call taken by: Payton Spark CMA,  March 26, 2010 4:22 PM  Follow-up for Phone Call        done. Follow-up by: Seymour Bars DO,  March 26, 2010 5:01 PM

## 2010-08-03 NOTE — Assessment & Plan Note (Signed)
Summary: ? R sided lung CA   Vital Signs:  Patient profile:   75 year old male Height:      69 inches Weight:      148 pounds BMI:     21.93 O2 Sat:      98 % on Room air Pulse rate:   92 / minute BP sitting:   127 / 59  (left arm) Cuff size:   large  Vitals Entered By: Payton Spark CMA (December 23, 2009 9:24 AM)  O2 Flow:  Room air CC: 1 mo f/u.    Primary Care Provider:  Seymour Bars DO  CC:  1 mo f/u. Marland Kitchen  History of Present Illness: 75 yo WM with COPD and hx of bleeding ulcers presents for f/u after having an abnormal CT of the chest last month, suspicious for R sided lung cancer in this long - time smoker who is cutting back.  he has seen Dr Lequita Halt in [facility] and had further w/u but no biopsy yet.  We have asked for these records today.  He denies feeing SOB, having any change in cough or sputum production.  Denies chest pain or tightness.  He uses nocturnal O2 and nebulized treatments 1-2 x a day at home.  He sees Dr Jason Fila for his GI bleeding, last had a blood transfusion in the Spring which helped his sympomatic LE claudication symptoms.    Emotionally, he is handling the news rather well.  He talked to his family about it and has been going to church.  he has a good support system.  Allergies: No Known Drug Allergies  Past History:  Past Medical History: anxiety HTN DM High cholesterol  PUD COPD, on home O2 severe iron def anemia PAD- WS CARDS ? R sided lung cancer  GI Dr Jason Fila CVTS Dr Lequita Halt  Past Surgical History: Reviewed history from 10/21/2009 and no changes required. EGD 2010/ colonoscopy 2010 --> mutiple small gastric ulcers  Social History: Reviewed history from 10/07/2009 and no changes required. Retired from [employer]. Married.  Has 3 daughters all local. Smokes 1 ppd Walks some. Wt stable. Denies ETOH.  Review of Systems General:  Complains of weight loss; denies chills, fatigue, fever, loss of appetite, and sweats. CV:  Denies chest pain  or discomfort, shortness of breath with exertion, and swelling of feet. Resp:  Complains of cough; denies coughing up blood, pleuritic, shortness of breath, and sputum productive. GI:  Complains of constipation; denies abdominal pain and nausea. Psych:  Denies anxiety and depression.  Physical Exam  General:  alert, well-developed, well-nourished, and well-hydrated.   Head:  normocephalic and atraumatic.   Eyes:  sclera non icteric Mouth:  pharynx pink and moist.   Neck:  no masses.   Lungs:  nonlabored breathing with bibasilar wheezing and mild rhonchi.  prolonged exp phase.  barrel chested Heart:  RRR w/o audible murmurs Abdomen:  soft and non-tender.   Extremities:  no LE edema Skin:  color normal.   Psych:  memory intact for recent and remote, good eye contact, not anxious appearing, and flat affect.     Impression & Recommendations:  Problem # 1:  MALIGNANT NEOPLASM BRONCHUS&LUNG UNSPEC SITE (ICD-162.9) Probable R sided lung cancer --> seeing Dr Lequita Halt and has f/u today. Has not yet had biopsy done. Working on smoking cessation. Pulse ox stable.  Pain - free and stable lung exam w/o SOB.  Problem # 2:  IMPAIRED FASTING GLUCOSE (ICD-790.21) I am going to stop his  metformin b/c of his high Cr of 1.74 last month.  Will monitor home sugars. The following medications were removed from the medication list:    Metformin Hcl 500 Mg Tabs (Metformin hcl) .Marland Kitchen... Take 1 tab by mouth two times a day  Problem # 3:  COPD (ICD-496) Working on smoking cessation.  good daytime pulse ox with minimal symptoms.  He has nighttime O2 and nebulizer machine at home to use as needed.   His updated medication list for this problem includes:    Proair Hfa 108 (90 Base) Mcg/act Aers (Albuterol sulfate) .Marland Kitchen... 2 puffs 4 x a day as needed for shortness of breath    Duoneb 0.5-2.5 (3) Mg/56ml Soln (Ipratropium-albuterol) .Marland KitchenMarland KitchenMarland KitchenMarland Kitchen 3 ml nebs 4 x a day  Problem # 4:  PUD (ICD-533.90) Pt sees Dr Jason Fila.  Reports  having a 'normal ' Hgb yesterday.  Having some mild claudication symptoms recur.  Will send this note back to him to see when he needs to come back.   His updated medication list for this problem includes:    Omeprazole 20 Mg Cpdr (Omeprazole) .Marland Kitchen... Take 1 tab by mouth once daily  Complete Medication List: 1)  Simvastatin 20 Mg Tabs (Simvastatin) .... Take 1 tab by mouth at bedtime 2)  Omeprazole 20 Mg Cpdr (Omeprazole) .... Take 1 tab by mouth once daily 3)  Amlodipine Besylate 10 Mg Tabs (Amlodipine besylate) .... Take 1 tab by mouth once daily 4)  Doxazosin Mesylate 4 Mg Tabs (Doxazosin mesylate) .... Take 2 tabs by mouth once daily 5)  Hydrochlorothiazide 25 Mg Tabs (Hydrochlorothiazide) .... Take 1/2  tab by mouth once daily 6)  Amitriptyline Hcl 25 Mg Tabs (Amitriptyline hcl) .... Take 2 tabs by mouth at bedtime 7)  Alprazolam 0.25 Mg Tabs (Alprazolam) .... Take 1 tab by mouth once daily as needed 8)  Proair Hfa 108 (90 Base) Mcg/act Aers (Albuterol sulfate) .... 2 puffs 4 x a day as needed for shortness of breath 9)  Freestyle Test Strp (Glucose blood) .... Use as directed to test blood sugar once daily 10)  Humidified Oxygen Via Nasal Canula  .Marland Kitchen.. 2l/min dx; copd with nocturnal hypoxemia 11)  Nebulizer Machine  .... Dx: copd send to [facility] 12)  Duoneb 0.5-2.5 (3) Mg/47ml Soln (Ipratropium-albuterol) .... 3 ml nebs 4 x a day 13)  Fenofibrate 160 Mg Tabs (Fenofibrate) .Marland Kitchen.. 1 tab by mouth daily  Patient Instructions: 1)  For constiptation, drink 1 glass of Prune juice daily with 1 scoop of Miralax daily.  Increase water intake. 2)  Stay on a stool softener daily like COLACE. 3)  Will follow along with testing by Dr Lequita Halt and Dr Jason Fila. 4)  Call me if you need anything. 5)  F/U for COPD in 3 mos.

## 2010-08-03 NOTE — Progress Notes (Signed)
Summary: Discuss father  Phone Note Call from Patient Call back at [phone]   Caller: Daughter- Zachary Rios Call For: Zachary Rios Summary of Call: Pt daughter calls and would like to speak with you in regards to her dad. Says she tried to talk with him but he is confused and he did come in a sign form for you to discuss his care with her due to his forgetfulness. Initial call taken by: Kathlene November LPN,  May 18, 2010 11:34 AM  Follow-up for Phone Call        I called his daughter, Zachary Gross and updated her on the hospice referral and how he is doing.  Follow-up by: Zachary Rios,  May 18, 2010 12:03 PM

## 2010-08-03 NOTE — Assessment & Plan Note (Signed)
Summary: ? Low HGB   Vital Signs:  Patient profile:   75 year old male Height:      69 inches Weight:      144 pounds BMI:     21.34 O2 Sat:      98 % on Room air Temp:     98.4 degrees F oral Pulse rate:   98 / minute BP sitting:   124 / 50  (left arm) Cuff size:   regular  Vitals Entered By: Payton Spark CMA (April 27, 2010 1:07 PM)  O2 Flow:  Room air CC: ? low hgb   Primary Care Provider:  Seymour Bars DO  CC:  ? low hgb.  History of Present Illness: Has been feeling fatigued and a little lightheaded. No syncope. no falls. No SOB. Hx of lung cancer adn still occ smokes. Last tranfusion was several months ago.   Allergies: No Known Drug Allergies  Past History:  Past Medical History: Last updated: 03/26/2010 anxiety HTN DM High cholesterol  PUD COPD, on home O2 severe iron def anemia PAD- WS CARDS Stage IA RUL squamous cell carcinoma  GI Dr Jason Fila CVTS Dr Lequita Halt pulm Dr Ether Griffins  Past Surgical History: Last updated: 03/26/2010 EGD 2010/ colonoscopy 2010 --> mutiple small gastric ulcers R thoracotomy/ RUL resection for lung cancer 02-2010  Family History: Last updated: 28-Oct-2009 mother died of abdomina aneursym father died cancer brother died lung cancer 2 sisters healthy  Social History: Last updated: 2009-10-28 Retired from [employer] certified [employer]. Married.  Has 3 daughters all local. Smokes 1 ppd Walks some. Wt stable. Denies ETOH.  Physical Exam  General:  Well-developed,well-nourished,in no acute distress; alert,appropriate and cooperative throughout examination Lungs:  normal respiratory effort.  Coarse inspiratory sounds. No wheezes or crackles.  Heart:  Normal rate and regular rhythm. S1 and S2 normal without gallop, murmur, click, rub or other extra sounds. Skin:  no rashes.   Psych:  Oriented X3, memory intact for recent and remote, normally interactive, good eye contact, and not anxious appearing.     Impression &  Recommendations:  Problem # 1:  ANEMIA, IRON DEFICIENCY (ICD-280.9)  Pt agrees to go to [facility] to eval for transfusion. He is not acutely losing blood but he is dizzy.  See Hgb level below.   Orders: Fingerstick (36416) Hgb (04540)  Complete Medication List: 1)  Simvastatin 20 Mg Tabs (Simvastatin) .... Take 1 tab by mouth at bedtime 2)  Omeprazole 20 Mg Cpdr (Omeprazole) .... Take 1 tab by mouth once daily 3)  Amlodipine Besylate 10 Mg Tabs (Amlodipine besylate) .... Take 1 tab by mouth once daily 4)  Doxazosin Mesylate 4 Mg Tabs (Doxazosin mesylate) .... Take 2 tabs by mouth once daily 5)  Amitriptyline Hcl 75 Mg Tabs (Amitriptyline hcl) .... Take 1 tablet by mouth once a day at bedtime 6)  Restoril 15 Mg Caps (Temazepam) .Marland Kitchen.. 1 capsule by mouth at bedtime as needed sleep 7)  Proair Hfa 108 (90 Base) Mcg/act Aers (Albuterol sulfate) .... 2 puffs 4 x a day as needed for shortness of breath 8)  Freestyle Test Strp (Glucose blood) .... Use as directed to test blood sugar once daily 9)  Humidified Oxygen Via Nasal Canula  .Marland Kitchen.. 2l/min dx; copd with nocturnal hypoxemia 10)  Nebulizer Machine  .... Dx: copd send to [facility] 11)  Duoneb 0.5-2.5 (3) Mg/5ml Soln (Ipratropium-albuterol) .... 3 ml nebs 4 x a day 12)  Alprazolam 0.5 Mg Tabs (Alprazolam) .... 1/2 to 1 tab by  mouth daily as needed for anxiety 13)  Hydrocodone-acetaminophen 5-325 Mg Tabs (Hydrocodone-acetaminophen) .Marland Kitchen.. 1-2 tabs by mouth three times a day as needed severe pain   Orders Added: 1)  Fingerstick [36416] 2)  Hgb [85018] 3)  Est. Patient Level II [16109]    Laboratory Results   CBC   HGB:  5.8 g/dL   (Normal Range: 60.4-54.0 in Males, 12.0-15.0 in Females)

## 2010-08-03 NOTE — Letter (Signed)
Summary: [facility]  [facility]   Imported By: Lanelle Bal 01/20/2010 12:15:39  _____________________________________________________________________  External Attachment:    Type:   Image     Comment:   External Document

## 2010-08-03 NOTE — Assessment & Plan Note (Signed)
Summary: COPD/ PUD/ A1C   Vital Signs:  Patient profile:   75 year old male Height:      69 inches Weight:      152 pounds BMI:     22.53 O2 Sat:      97 % on Room air Pulse rate:   92 / minute BP sitting:   129 / 62  (left arm) Cuff size:   large  Vitals Entered By: Payton Spark CMA (Nov 20, 2009 9:14 AM)  O2 Flow:  Room air CC: F/U COPD   Primary Care Provider:  Seymour Bars DO  CC:  F/U COPD.  History of Present Illness: 75 yo WM presents for f/u COPD and iron deficiency anemia secondary to PUD.  I reviewed his notes and he is due for his 6 wk f/u CXR in the next wk.  His breathing has improved.  He is still smoking.  He is using nocturnal O2.  He is using DuoNEb 1 x a day.  He uses his rescue HFA if he is working out in the yard.    He has not any epigastric pain, melena, or hematochezia.  He is eating well.  He has not had any wt loss.  He is on oral iron and omeprazole daily.  He avoids ETOH and NSAIDs.  It has been a year since his last EGD/ colonosocpy and he as not followed up with GI.    Allergies: No Known Drug Allergies  Past History:  Past Medical History: anxiety HTN DM High cholesterol  PUD COPD, on home O2 severe iron def anemia PAD- [facility] CARDS  GI Dr Jason Fila  Past Surgical History: Reviewed history from 10/21/2009 and no changes required. EGD 2010/ colonoscopy 2010 --> mutiple small gastric ulcers  Social History: Reviewed history from 10/07/2009 and no changes required. Retired from [employer]. Married.  Has 3 daughters all local. Smokes 1 ppd Walks some. Wt stable. Denies ETOH.  Review of Systems      See HPI  Physical Exam  General:  alert, well-developed, well-nourished, and well-hydrated.   Head:  normocephalic and atraumatic.   Eyes:  eyelid ptosis bilat Nose:  no nasal discharge.   Mouth:  pharynx pink and moist and edentulous.   Neck:  no masses.  no audible carotid bruits Lungs:  nonlabored breathing with bibasilar  wheezing and mild rhonchi.  prolonged exp phase.  barrel chested Heart:  RRR w/o audible murmurs Abdomen:  soft and non-tender.   Skin:  color normal and no rashes.  no pallor Psych:  good eye contact, not anxious appearing, and not depressed appearing.     Impression & Recommendations:  Problem # 1:  ABNORMAL CHEST XRAY (ICD-793.1) Repeat CXR  next wk after having an abnormal one in the hospital 4 wks ago.  Smoker. Orders: T-DG Chest 2 View [id number])  Problem # 2:  ANEMIA, IRON DEFICIENCY (ICD-280.9) Continue iron.  Not due yet to recheck levels.  Symptoms have improved.  No longer having symptoms of claudication.  Problem # 3:  PUD (ICD-533.90)  I reviewed is last procedure note from Dr Jason Fila, 2010.  He never did f/u with Dr Jason Fila in the office.  my concern is that he required another blood transfusion from bleeding ulcers and failed to f/u with GI.  I will set him back up and continue his PPI. His updated medication list for this problem includes:    Omeprazole 20 Mg Cpdr (Omeprazole) .Marland Kitchen... Take 1 tab by mouth once daily  Orders: Gastroenterology Referral (GI)  Problem # 4:  COPD (ICD-496) Counseled on smoking cessation.  He is not ready to quit at this point.  Continue current meds and set up formal PFTs. On nocturnal O2 which has helped his dyspnea. His updated medication list for this problem includes:    Proair Hfa 108 (90 Base) Mcg/act Aers (Albuterol sulfate) .Marland Kitchen... 2 puffs 4 x a day as needed for shortness of breath    Duoneb 0.5-2.5 (3) Mg/61ml Soln (Ipratropium-albuterol) .Marland KitchenMarland KitchenMarland KitchenMarland Kitchen 3 ml nebs 4 x a day  Orders: Pulmonary Referral (Pulmonary)  Complete Medication List: 1)  Metformin Hcl 500 Mg Tabs (Metformin hcl) .... Take 1 tab by mouth two times a day 2)  Simvastatin 20 Mg Tabs (Simvastatin) .... Take 1 tab by mouth at bedtime 3)  Omeprazole 20 Mg Cpdr (Omeprazole) .... Take 1 tab by mouth once daily 4)  Amlodipine Besylate 10 Mg Tabs (Amlodipine besylate) .... Take 1 tab by  mouth once daily 5)  Doxazosin Mesylate 4 Mg Tabs (Doxazosin mesylate) .... Take 2 tabs by mouth once daily 6)  Hydrochlorothiazide 25 Mg Tabs (Hydrochlorothiazide) .... Take 1/2  tab by mouth once daily 7)  Amitriptyline Hcl 25 Mg Tabs (Amitriptyline hcl) .... Take 2 tabs by mouth at bedtime 8)  Alprazolam 0.25 Mg Tabs (Alprazolam) .... Take 1 tab by mouth once daily as needed 9)  Proair Hfa 108 (90 Base) Mcg/act Aers (Albuterol sulfate) .... 2 puffs 4 x a day as needed for shortness of breath 10)  Freestyle Test Strp (Glucose blood) .... Use as directed to test blood sugar once daily 11)  Humidified Oxygen Via Nasal Canula  .Marland Kitchen.. 2l/min dx; copd with nocturnal hypoxemia 12)  Nebulizer Machine  .... Dx: copd send to [facility] 13)  Duoneb 0.5-2.5 (3) Mg/89ml Soln (Ipratropium-albuterol) .... 3 ml nebs 4 x a day 14)  Fenofibrate 160 Mg Tabs (Fenofibrate) .Marland Kitchen.. 1 tab by mouth daily  Other Orders: Fingerstick (36416) Hemoglobin A1C (02725)  Patient Instructions: 1)  Have CXR done NEXT FRIDAY downstairs for follow up. 2)  Will set you back up with GI outpatient re: Peptic Ulcer Disease. 3)  Will set you up with Pulmonology for Pulmonary Function testing re: COPD. 4)  Return in 1 month for f/u iron deficiency anemia.    Laboratory Results   Blood Tests     HGBA1C: 4.5%   (Normal Range: Non-Diabetic - 3-6%   Control Diabetic - 6-8%)

## 2010-08-03 NOTE — Letter (Signed)
Summary: [facility]  [facility]   Imported By: Lanelle Bal 04/05/2010 10:42:36  _____________________________________________________________________  External Attachment:    Type:   Image     Comment:   External Document

## 2010-08-03 NOTE — Assessment & Plan Note (Signed)
Summary: f/u meds   Vital Signs:  Patient profile:   75 year old male Height:      69 inches Weight:      145 pounds BMI:     21.49 O2 Sat:      94 % on Room air Temp:     98.4 degrees F oral Pulse rate:   92 / minute BP sitting:   117 / 54  (left arm) Cuff size:   regular  Vitals Entered By: Payton Spark CMA (May 14, 2010 10:17 AM)  O2 Flow:  Room air CC: Med rec and mail order refills   Primary Care Taevyn Hausen:  Seymour Bars DO  CC:  Med rec and mail order refills.  History of Present Illness: 75 yo WM prsents for RF meds.  He has severe iron def anemiia (secondary to PUD)  which he was recently hospitalized for.  He was recently diagnosed with poorly differentiated squamous celll lung cancer.  He is here today with questions about his medications.  He brought them all it today and  needs 90 day RXs done.  He has COPD also,  on 02 and nebs.     Current Medications (verified): 1)  Simvastatin 20 Mg Tabs (Simvastatin) .... Take 1 Tab By Mouth At Bedtime 2)  Amitriptyline Hcl 75 Mg Tabs (Amitriptyline Hcl) .... Take 1 Tablet By Mouth Once A Day At Bedtime 3)  Restoril 15 Mg Caps (Temazepam) .Marland Kitchen.. 1 Capsule By Mouth At Bedtime As Needed Sleep 4)  Proair Hfa 108 (90 Base) Mcg/act Aers (Albuterol Sulfate) .... 2 Puffs 4 X A Day As Needed For Shortness of Breath 5)  Freestyle Test  Strp (Glucose Blood) .... Use As Directed To Test Blood Sugar Once Daily 6)  Humidified Oxygen Via Nasal Canula .Marland Kitchen.. 2l/min Dx; Copd With Nocturnal Hypoxemia 7)  Nebulizer Machine .... Dx: Copd Send To [facility] 8)  Duoneb 0.5-2.5 (3) Mg/22ml Soln (Ipratropium-Albuterol) .... 3 Ml Nebs 4 X A Day 9)  Alprazolam 0.5 Mg Tabs (Alprazolam) .... 1/2 To 1 Tab By Mouth Daily As Needed For Anxiety 10)  Hydrocodone-Acetaminophen 5-325 Mg Tabs (Hydrocodone-Acetaminophen) .Marland Kitchen.. 1-2 Tabs By Mouth Three Times A Day As Needed Severe Pain 11)  Poly-Iron 150 150 Mg Caps (Polysaccharide Iron Complex) .... Take 1 Cap By  Mouth Two Times A Day  Allergies (verified): No Known Drug Allergies  Past History:  Past Medical History: Reviewed history from 03/26/2010 and no changes required. anxiety HTN DM High cholesterol  PUD COPD, on home O2 severe iron def anemia PAD- WS CARDS Stage IA RUL squamous cell carcinoma  GI Dr Jason Fila CVTS Dr Lequita Halt pulm Dr Ether Griffins  Past Surgical History: Reviewed history from 03/26/2010 and no changes required. EGD 2010/ colonoscopy 2010 --> mutiple small gastric ulcers R thoracotomy/ RUL resection for lung cancer 02-2010  Family History: Reviewed history from 10/07/2009 and no changes required. mother died of abdomina aneursym father died cancer brother died lung cancer 2 sisters healthy  Social History: Reviewed history from 10/07/2009 and no changes required. Retired from [employer]. Married.  Has 3 daughters all local. Smokes 1 ppd Walks some. Wt stable. Denies ETOH.  Review of Systems       The patient complains of dyspnea on exertion and severe indigestion/heartburn.  The patient denies anorexia, weight loss, weight gain, chest pain, peripheral edema, prolonged cough, headaches, abdominal pain, melena, and hematochezia.    Physical Exam  General:  alert.  in NAD.  pale with dry mouth  Head:  normocephalic and atraumatic.  no temporal wasting. Eyes:  sclera non icteric Nose:  no nasal discharge.   Mouth:  teeth missing and halitosis.  dry mucosa and lips with what appears to be dried blood on the lips Neck:  no masses.   Chest Wall:  no tenderness.   Lungs:  mildly labored at rest, prolonged exp phase with exp wheezing and rhonchi Heart:  normal rate and regular rhythm.  no audible murmurs Abdomen:  soft.  mildly distended Extremities:  trace LE edema Skin:  pallor Psych:  good eye contact, not anxious appearing, and not depressed appearing.     Impression & Recommendations:  Problem # 1:  MALIGNANT NEOPLASM BRONCHUS&LUNG UNSPEC SITE  (ICD-162.9) I reviewed Mr Lottes's medications with him and made sure he had everything he needed and knew when to take each.  RFs done for mail order.  Given his lung cancer, COPD, PAD and recurring hospitalizations for anemia with PUD, I think he is a good candidate for hospice.  This would help him at home with nebs, O2, pain meds and med management.   Orders: Hospice Referral (Hospice)  Complete Medication List: 1)  Simvastatin 20 Mg Tabs (Simvastatin) .... Take 1 tab by mouth at bedtime 2)  Amitriptyline Hcl 75 Mg Tabs (Amitriptyline hcl) .... Take 1 tablet by mouth once a day at bedtime 3)  Restoril 15 Mg Caps (Temazepam) .Marland Kitchen.. 1 capsule by mouth at bedtime as needed sleep 4)  Proair Hfa 108 (90 Base) Mcg/act Aers (Albuterol sulfate) .... 2 puffs 4 x a day as needed for shortness of breath 5)  Freestyle Test Strp (Glucose blood) .... Use as directed to test blood sugar once daily 6)  Humidified Oxygen Via Nasal Canula  .Marland Kitchen.. 2l/min dx; copd with nocturnal hypoxemia 7)  Nebulizer Machine  .... Dx: copd send to [facility] 8)  Duoneb 0.5-2.5 (3) Mg/72ml Soln (Ipratropium-albuterol) .... 3 ml nebs 4 x a day 9)  Alprazolam 0.5 Mg Tabs (Alprazolam) .... 1/2 to 1 tab by mouth daily as needed for anxiety 10)  Hydrocodone-acetaminophen 5-325 Mg Tabs (Hydrocodone-acetaminophen) .Marland Kitchen.. 1-2 tabs by mouth three times a day as needed severe pain 11)  Poly-iron 150 150 Mg Caps (Polysaccharide iron complex) .... Take 1 cap by mouth two times a day 12)  Omeprazole 40 Mg Cpdr (Omeprazole) .Marland Kitchen.. 1 tab by mouth qam  Patient Instructions: 1)  Meds all RFd thru [facility]. 2)  Hospice referral made to help you at home with your meds and COPD.  3)  Call me if you have any problems. 4)  Return for f/u in 3 mos. Prescriptions: OMEPRAZOLE 40 MG CPDR (OMEPRAZOLE) 1 tab by mouth qAM  #90 x 1   Entered and Authorized by:   Seymour Bars DO   Signed by:   Seymour Bars DO on 05/14/2010   Method used:   Print then Give  to Patient   RxID:   [id number] HYDROCODONE-ACETAMINOPHEN 5-325 MG TABS (HYDROCODONE-ACETAMINOPHEN) 1-2 tabs by mouth three times a day as needed severe pain  #100 x 0   Entered and Authorized by:   Seymour Bars DO   Signed by:   Seymour Bars DO on 05/14/2010   Method used:   Print then Give to Patient   RxID:   [id number] PROAIR HFA 108 (90 BASE) MCG/ACT AERS (ALBUTEROL SULFATE) 2 puffs 4 x a day as needed for shortness of breath  #2 x 1   Entered and Authorized by:  Seymour Bars DO   Signed by:   Seymour Bars DO on 05/14/2010   Method used:   Print then Give to Patient   RxID:   [id number] POLY-IRON 150 150 MG CAPS (POLYSACCHARIDE IRON COMPLEX) Take 1 cap by mouth two times a day  #180 x 1   Entered and Authorized by:   Seymour Bars DO   Signed by:   Seymour Bars DO on 05/14/2010   Method used:   Print then Give to Patient   RxID:   [id number] ALPRAZOLAM 0.5 MG TABS (ALPRAZOLAM) 1/2 to 1 tab by mouth daily as needed for anxiety  #90 x 0   Entered and Authorized by:   Seymour Bars DO   Signed by:   Seymour Bars DO on 05/14/2010   Method used:   Print then Give to Patient   RxID:   [id number] RESTORIL 15 MG CAPS (TEMAZEPAM) 1 capsule by mouth at bedtime as needed sleep  #90 x 1   Entered and Authorized by:   Seymour Bars DO   Signed by:   Seymour Bars DO on 05/14/2010   Method used:   Print then Give to Patient   RxID:   [id number] AMITRIPTYLINE HCL 75 MG TABS (AMITRIPTYLINE HCL) Take 1 tablet by mouth once a day at bedtime  #90 x 1   Entered and Authorized by:   Seymour Bars DO   Signed by:   Seymour Bars DO on 05/14/2010   Method used:   Print then Give to Patient   RxID:   [id number] SIMVASTATIN 20 MG TABS (SIMVASTATIN) Take 1 tab by mouth at bedtime  #90 x 1   Entered and Authorized by:   Seymour Bars DO   Signed by:   Seymour Bars DO on 05/14/2010   Method used:   Print then Give to Patient   RxID:   [id number]    Orders Added: 1)   Hospice Referral [Hospice] 2)  Est. Patient Level III [62376]

## 2010-08-03 NOTE — Consult Note (Signed)
Summary: [facility]  [facility]   Imported By: Lanelle Bal 01/05/2010 09:54:43  _____________________________________________________________________  External Attachment:    Type:   Image     Comment:   External Document

## 2010-08-03 NOTE — Progress Notes (Signed)
Summary: Faxed neb order to [facility]  Phone Note Outgoing Call   Summary of Call: Sharyn Creamer at [phone] and spoke with [facility] and informed her that patient is here in the office and needs Home Nebulizer started TODAY. Brandy asked that I just fax them the order and they will deliver Nebulizer and Oxygen to him today. I did in fact fax order to Fax # [phone].Michaelle Copas  October 16, 2009 12:12 PM  Initial call taken by: Michaelle Copas,  October 16, 2009 12:12 PM

## 2010-08-03 NOTE — Consult Note (Signed)
Summary: [facility]  [facility]   Imported By: Lanelle Bal 01/20/2010 12:17:42  _____________________________________________________________________  External Attachment:    Type:   Image     Comment:   External Document

## 2010-08-03 NOTE — Letter (Signed)
Summary: [facility]  [facility]   Imported By: Lanelle Bal 03/11/2010 09:31:10  _____________________________________________________________________  External Attachment:    Type:   Image     Comment:   External Document

## 2010-08-03 NOTE — Progress Notes (Signed)
Summary: Rx. Call in  Phone Note Call from Patient   Caller: Patient Summary of Call: Pt's sister states that his pharmacy closes at 6:00 if you could go ahead and call in his Rx. for the increased dose on Alprazolam and Amitriptyline to his pharmacy would be great... If you have any questions, you can contact pt on his cell.Michaelle Copas  February 25, 2010 4:56 PM  Initial call taken by: Michaelle Copas,  February 25, 2010 4:56 PM

## 2010-08-03 NOTE — Miscellaneous (Signed)
Summary: Consult Order/[facility]   Imported By: Lanelle Bal 06/08/2010 13:57:46  _____________________________________________________________________  External Attachment:    Type:   Image     Comment:   External Document

## 2010-08-03 NOTE — Letter (Signed)
Summary: [facility]  [facility]   Imported By: Lanelle Bal 10/27/2009 14:26:02  _____________________________________________________________________  External Attachment:    Type:   Image     Comment:   External Document

## 2010-08-03 NOTE — Assessment & Plan Note (Signed)
Summary: SOB   Vital Signs:  Patient profile:   75 year old male Height:      69 inches Weight:      155 pounds BMI:     22.97 O2 Sat:      97 % on Room air Temp:     98.2 degrees F oral Pulse rate:   86 / minute BP sitting:   126 / 52  (left arm) Cuff size:   regular  Vitals Entered By: Payton Spark CMA (October 16, 2009 11:24 AM)  O2 Flow:  Room air  Serial Vital Signs/Assessments:  Comments: 11:45 AM Post Peak Flow- 120 RED ZONE By: Payton Spark CMA   CC: DOE.    Primary Care Provider:  Seymour Bars DO  CC:  DOE. Marland Kitchen  History of Present Illness: 75 yo WM presents for feeling more SOB yesterday.  He was pressure washing.  He is coughing more.  He has some sputum production.  He has COPD and conitnues to smoke. Zachary Rios is setting him up for home O2 at night after having hypoxemia on an overnight oximetry.  He has hx of recent bleeding ulcer.  He is due to have his Hgb checked.  He denies any chest pain and his sister states that he had a normal cath in the past year.  He denies having any abd pain, melena or hematochezia.  He notes that he has puffy bags under his eyes.  Denies paroxysmal nocturnal dyspnea or orthopnea.  He has been using a ProAir rescue inhaler as needed.    Current Medications (verified): 1)  Metformin Hcl 500 Mg Tabs (Metformin Hcl) .... Take 1 Tab By Mouth Two Times A Day 2)  Simvastatin 20 Mg Tabs (Simvastatin) .... Take 1 Tab By Mouth At Bedtime 3)  Omeprazole 20 Mg Cpdr (Omeprazole) .... Take 1 Tab By Mouth Once Daily 4)  Amlodipine Besylate 10 Mg Tabs (Amlodipine Besylate) .... Take 1 Tab By Mouth Once Daily 5)  Doxazosin Mesylate 4 Mg Tabs (Doxazosin Mesylate) .... Take 2 Tabs By Mouth Once Daily 6)  Hydrochlorothiazide 25 Mg Tabs (Hydrochlorothiazide) .... Take 1/2  Tab By Mouth Once Daily 7)  Amitriptyline Hcl 25 Mg Tabs (Amitriptyline Hcl) .... Take 2 Tabs By Mouth At Bedtime 8)  Alprazolam 0.25 Mg Tabs (Alprazolam) .... Take 1 Tab By Mouth  Once Daily As Needed 9)  Proair Hfa 108 (90 Base) Mcg/act Aers (Albuterol Sulfate) .... 2 Puffs 4 X A Day As Needed For Shortness of Breath 10)  Freestyle Test  Strp (Glucose Blood) .... Use As Directed To Test Blood Sugar Once Daily 11)  Humidified Oxygen Via Nasal Canula .Marland Kitchen.. 2l/min Dx; Copd With Nocturnal Hypoxemia  Allergies (verified): No Known Drug Allergies  Past History:  Past Medical History: Reviewed history from 10/07/2009 and no changes required. anxiety HTN DM High cholesterol  PUD  Past Surgical History: Reviewed history from 10/07/2009 and no changes required. EGD 2010/ colonoscopy 2010  Family History: Reviewed history from 10/07/2009 and no changes required. mother died of abdomina aneursym father died cancer brother died lung cancer 2 sisters healthy  Social History: Reviewed history from 10/07/2009 and no changes required. Retired from [employer]. Married.  Has 3 daughters all local. Smokes 1 ppd Walks some. Wt stable. Denies ETOH.  Review of Systems      See HPI  Physical Exam  General:  pale appearing elderly WM in NAD Head:  normocephalic and atraumatic.  under eye edema Eyes:  conjunctiva clear  Mouth:  pharynx pink and moist.   Neck:  no masses.   Chest Wall:  barrel chested Lungs:  prolonged exp phase. labored.  tachypneic.  wheezing bilat Heart:  RRR w/ sinus arrythmia, 2/6 systolic murmur Abdomen:  soft and non-tender.   Extremities:  no LE edema Skin:  pale appearing Cervical Nodes:  No lymphadenopathy noted Psych:  good eye contact, not anxious appearing, and not depressed appearing.     Impression & Recommendations:  Problem # 1:  UNSPECIFIED ANEMIA (ICD-285.9) Hgb 4.2 today in the office. Profound anemia with SOB and recent bleeding ulcer (not currently having any symptoms of this). Send directly to [facility] hospital for eval and tx, blood transfusion. Sister is taking him now since he is stable.   I spoke  to Triage nurse and faxed his information.  Problem # 2:  DYSPNEA ON EXERTION (ICD-786.09) Likely related to profound anemia.  Will hold off on CXR -- this can be done in the ED. EKG shows NSR with PVCs.  ST depression in anterior lateral leads but no old one to compare it to. DuoNeb given in office.  PFs in red zone.  His COPD exac can be managed by the ED. He will likely need hospitalization.   Orders: EKG w/ Interpretation (93000) T-DG Chest 2 View [phone])  His updated medication list for this problem includes:    Hydrochlorothiazide 25 Mg Tabs (Hydrochlorothiazide) .Marland Kitchen... Take 1/2  tab by mouth once daily    Proair Hfa 108 (90 Base) Mcg/act Aers (Albuterol sulfate) .Marland Kitchen... 2 puffs 4 x a day as needed for shortness of breath    Duoneb 0.5-2.5 (3) Mg/77ml Soln (Ipratropium-albuterol) .Marland KitchenMarland KitchenMarland KitchenMarland Kitchen 3 ml nebs 4 x a day  Complete Medication List: 1)  Metformin Hcl 500 Mg Tabs (Metformin hcl) .... Take 1 tab by mouth two times a day 2)  Simvastatin 20 Mg Tabs (Simvastatin) .... Take 1 tab by mouth at bedtime 3)  Omeprazole 20 Mg Cpdr (Omeprazole) .... Take 1 tab by mouth once daily 4)  Amlodipine Besylate 10 Mg Tabs (Amlodipine besylate) .... Take 1 tab by mouth once daily 5)  Doxazosin Mesylate 4 Mg Tabs (Doxazosin mesylate) .... Take 2 tabs by mouth once daily 6)  Hydrochlorothiazide 25 Mg Tabs (Hydrochlorothiazide) .... Take 1/2  tab by mouth once daily 7)  Amitriptyline Hcl 25 Mg Tabs (Amitriptyline hcl) .... Take 2 tabs by mouth at bedtime 8)  Alprazolam 0.25 Mg Tabs (Alprazolam) .... Take 1 tab by mouth once daily as needed 9)  Proair Hfa 108 (90 Base) Mcg/act Aers (Albuterol sulfate) .... 2 puffs 4 x a day as needed for shortness of breath 10)  Freestyle Test Strp (Glucose blood) .... Use as directed to test blood sugar once daily 11)  Humidified Oxygen Via Nasal Canula  .Marland Kitchen.. 2l/min dx; copd with nocturnal hypoxemia 12)  Nebulizer Machine  .... Dx: copd send to [facility] 13)  Duoneb 0.5-2.5 (3)  Mg/83ml Soln (Ipratropium-albuterol) .... 3 ml nebs 4 x a day 14)  Doxycycline Hyclate 100 Mg Tabs (Doxycycline hyclate) .Marland Kitchen.. 1 tab by mouth two times a day x 10 days  Other Orders: Nebulizer Tx (62130) Albuterol up to 2.5mg  with Ipratropium (Q6578)  Patient Instructions: 1)  [facility] to Set up Nebulizer machine at home today. 2)  Start using DUONEBS 4 x a day for COPD/ shortness of breathe. 3)  Steroid shot today. 4)  EKG today. 5)  CXR today. 6)  Hgb: 7)  Labs downstairs today. 8)  Will call you w/ results this evening. 9)  Return for f/u COPD on Tuesday. Prescriptions: DOXYCYCLINE HYCLATE 100 MG TABS (DOXYCYCLINE HYCLATE) 1 tab by mouth two times a day x 10 days  #20 x 0   Entered and Authorized by:   Zachary Bars DO   Signed by:   Zachary Bars DO on 10/16/2009   Method used:   Electronically to        [facility] (retail)       [address]       [place], [place]  [address]       Ph: [phone]       Fax: [phone]   RxID:   [id number] DUONEB 0.5-2.5 (3) MG/3ML SOLN (IPRATROPIUM-ALBUTEROL) 3 ml Nebs 4 x a day  #1 month x 2   Entered and Authorized by:   Zachary Bars DO   Signed by:   Zachary Bars DO on 10/16/2009   Method used:   Printed then faxed to ...       [facility] (retail)       [address]       [place], [place]  [address]       Ph: [phone]       Fax: [phone]   RxID:   [id number] NEBULIZER MACHINE dx: COPD send to [facility]  #1 x 0   Entered and Authorized by:   Zachary Bars DO   Signed by:   Zachary Bars DO on 10/16/2009   Method used:   Printed then faxed to ...       [facility]* (retail)       [address]       [place], [place]  [address]       Ph: [phone]       Fax: [phone]   RxID:   [id number]    Medication Administration  Medication # 1:    Medication: Albuterol Sulfate Sol 1mg  unit dose    Diagnosis: COPD (ICD-496)    Route: inhaled    Patient tolerated medication without complications    Given by: Payton Spark CMA (October 16, 2009 11:25 AM)  Medication # 2:    Medication: Ipratropium inhalation sol. unit dose    Diagnosis: COPD (ICD-496)    Route: inhaled    Patient tolerated medication without complications    Given by: Payton Spark CMA (October 16, 2009 11:25 AM)  Orders Added: 1)  EKG w/ Interpretation [93000] 2)  T-DG Chest 2 View [71020] 3)  Est. Patient Level IV [07371] 4)  Nebulizer Tx [94640] 5)  Albuterol up to 2.5mg  with Ipratropium [J7620]   Appended Document: SOB     Allergies: No Known Drug Allergies   Complete Medication List: 1)  Metformin Hcl 500 Mg Tabs (Metformin hcl) .... Take 1 tab by mouth two times a day 2)  Simvastatin 20 Mg Tabs (Simvastatin) .... Take 1 tab by mouth at bedtime 3)  Omeprazole 20 Mg Cpdr (Omeprazole) .... Take 1 tab by mouth once daily 4)  Amlodipine Besylate 10 Mg Tabs (Amlodipine besylate) .... Take 1 tab by mouth once daily 5)  Doxazosin Mesylate 4 Mg Tabs (Doxazosin mesylate) .... Take 2 tabs by mouth once daily 6)  Hydrochlorothiazide 25 Mg Tabs (Hydrochlorothiazide) .... Take 1/2  tab by mouth once daily 7)  Amitriptyline Hcl 25 Mg Tabs (Amitriptyline hcl) .... Take 2 tabs by mouth at bedtime 8)  Alprazolam 0.25 Mg Tabs (Alprazolam) .... Take 1 tab by mouth once daily as needed 9)  Proair  Hfa 108 (90 Base) Mcg/act Aers (Albuterol sulfate) .... 2 puffs 4 x a day as needed for shortness of breath 10)  Freestyle Test Strp (Glucose blood) .... Use as directed to test blood sugar once daily 11)  Humidified Oxygen Via Nasal Canula  .Marland Kitchen.. 2l/min dx; copd with nocturnal hypoxemia 12)  Nebulizer Machine  .... Dx: copd send to [facility] 13)  Duoneb 0.5-2.5 (3) Mg/64ml Soln (Ipratropium-albuterol) .... 3 ml nebs 4 x a day 14)  Doxycycline Hyclate 100 Mg Tabs (Doxycycline hyclate) .Marland Kitchen.. 1 tab by mouth two times a day x 10 days  Other Orders: Fingerstick (14431) Hgb (54008)   Laboratory Results   CBC   HGB:  4.2 g/dL   (Normal Range: 67.6-19.5 in Males,  12.0-15.0 in Females)

## 2010-08-03 NOTE — Assessment & Plan Note (Signed)
Summary: PVD   Vital Signs:  Patient profile:   75 year old male Height:      69 inches Weight:      151 pounds BMI:     22.38 O2 Sat:      96 % on Room air Pulse rate:   88 / minute BP sitting:   137 / 59  (left arm) Cuff size:   large  Vitals Entered By: Payton Spark CMA (October 27, 2009 10:40 AM)  O2 Flow:  Room air CC: Discuss cardiology referral.    Primary Care Provider:  Seymour Bars DO  CC:  Discuss cardiology referral. .  History of Present Illness: 75 yo WM presents with questions about his recent PAD w/u.  This was done last wk at [facility] cardiology for symptoms of intermittent claudication wtih risk factors of heavy smoking, HTN and high cholesterol.  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  He states that his 'leg pain' has improved some after his blood transfusion but he has not been as physically active.  he does continue to smoke.  He has seen Dr Emelda Brothers in the past.  Current Medications (verified): 1)  Metformin Hcl 500 Mg Tabs (Metformin Hcl) .... Take 1 Tab By Mouth Two Times A Day 2)  Simvastatin 20 Mg Tabs (Simvastatin) .... Take 1 Tab By Mouth At Bedtime 3)  Omeprazole 20 Mg Cpdr (Omeprazole) .... Take 1 Tab By Mouth Once Daily 4)  Amlodipine Besylate 10 Mg Tabs (Amlodipine Besylate) .... Take 1 Tab By Mouth Once Daily 5)  Doxazosin Mesylate 4 Mg Tabs (Doxazosin Mesylate) .... Take 2 Tabs By Mouth Once Daily 6)  Hydrochlorothiazide 25 Mg Tabs (Hydrochlorothiazide) .... Take 1/2  Tab By Mouth Once Daily 7)  Amitriptyline Hcl 25 Mg Tabs (Amitriptyline Hcl) .... Take 2 Tabs By Mouth At Bedtime 8)  Alprazolam 0.25 Mg Tabs (Alprazolam) .... Take 1 Tab By Mouth Once Daily As Needed 9)  Proair Hfa 108 (90 Base) Mcg/act Aers (Albuterol Sulfate) .... 2 Puffs 4 X A Day As Needed For Shortness of Breath 10)  Freestyle Test  Strp (Glucose Blood) .... Use As Directed To Test Blood Sugar  Once Daily 11)  Humidified Oxygen Via Nasal Canula .Marland Kitchen.. 2l/min Dx; Copd With Nocturnal Hypoxemia 12)  Nebulizer Machine .... Dx: Copd Send To [facility] 13)  Duoneb 0.5-2.5 (3) Mg/74ml Soln (Ipratropium-Albuterol) .... 3 Ml Nebs 4 X A Day 14)  Fenofibrate 160 Mg Tabs (Fenofibrate) .Marland Kitchen.. 1 Tab By Mouth Daily  Allergies (verified): No Known Drug Allergies  Past History:  Past Medical History: anxiety HTN DM High cholesterol  PUD COPD, on home O2 severe iron def anemia PAD- [facility] CARDS  Past Surgical History: Reviewed history from 10/21/2009 and no changes required. EGD 2010/ colonoscopy 2010 --> mutiple small gastric ulcers  Family History: Reviewed history from 10/07/2009 and no changes required. mother died of abdomina aneursym father died cancer brother died lung cancer 2 sisters healthy  Social History: Reviewed history from 10/07/2009 and no changes required. Retired from [employer] certified [employer]. Married.  Has 3 daughters all local. Smokes 1 ppd Walks some. Wt stable. Denies ETOH.  Review of Systems      See HPI  Physical Exam  General:  alert, well-developed, well-nourished, and well-hydrated.   Skin:  color normal.   Psych:  good eye contact, not anxious appearing, and not depressed appearing.  Impression & Recommendations:  Problem # 1:  PVD (ICD-443.9) Reviewed results of PAD testing showing severe stenosis of the R femoral artery c/w with his symptoms of intermittent claudication. Will get him back in with [facility] to discuss stenting vs surgerical options in addition to working on his risk factors ( smoking, BP, cholesterol).   He is agreement to proceed with this plan.  Complete Medication List: 1)  Metformin Hcl 500 Mg Tabs (Metformin hcl) .... Take 1 tab by mouth two times a day 2)  Simvastatin 20 Mg Tabs (Simvastatin) .... Take 1 tab by mouth at bedtime 3)  Omeprazole 20 Mg Cpdr (Omeprazole) .... Take 1 tab by mouth once daily 4)  Amlodipine Besylate 10 Mg Tabs  (Amlodipine besylate) .... Take 1 tab by mouth once daily 5)  Doxazosin Mesylate 4 Mg Tabs (Doxazosin mesylate) .... Take 2 tabs by mouth once daily 6)  Hydrochlorothiazide 25 Mg Tabs (Hydrochlorothiazide) .... Take 1/2  tab by mouth once daily 7)  Amitriptyline Hcl 25 Mg Tabs (Amitriptyline hcl) .... Take 2 tabs by mouth at bedtime 8)  Alprazolam 0.25 Mg Tabs (Alprazolam) .... Take 1 tab by mouth once daily as needed 9)  Proair Hfa 108 (90 Base) Mcg/act Aers (Albuterol sulfate) .... 2 puffs 4 x a day as needed for shortness of breath 10)  Freestyle Test Strp (Glucose blood) .... Use as directed to test blood sugar once daily 11)  Humidified Oxygen Via Nasal Canula  .Marland Kitchen.. 2l/min dx; copd with nocturnal hypoxemia 12)  Nebulizer Machine  .... Dx: copd send to [facility] 13)  Duoneb 0.5-2.5 (3) Mg/106ml Soln (Ipratropium-albuterol) .... 3 ml nebs 4 x a day 14)  Fenofibrate 160 Mg Tabs (Fenofibrate) .Marland Kitchen.. 1 tab by mouth daily

## 2010-08-03 NOTE — Consult Note (Signed)
Summary: [facility]   Imported By: Lanelle Bal 05/19/2010 11:19:43  _____________________________________________________________________  External Attachment:    Type:   Image     Comment:   External Document

## 2010-08-03 NOTE — Progress Notes (Signed)
Summary: [facility] DC  Phone Note From Other Clinic   Caller: [facility] Summary of Call: Pt was DC from [facility] this AM. Pt had normal BM on Sat and Sun and no longer needs [facility]. Pt does have f/u apt here on 03/26/10. Initial call taken by: Payton Spark CMA,  February 22, 2010 10:01 AM

## 2010-08-03 NOTE — Assessment & Plan Note (Signed)
Summary: Depression, anxiety   Vital Signs:  Patient profile:   75 year old male Height:      69 inches Weight:      140 pounds Pulse rate:   98 / minute BP sitting:   130 / 60  (left arm) Cuff size:   regular  Vitals Entered By: Avon Gully CMA, Duncan Dull) (February 25, 2010 2:36 PM) CC: very anxious, cant sleep or eat,dx with two diff types of cancer and has been feeling depressed felt this was since tues   Primary Care Provider:  Seymour Bars DO  CC:  very anxious, cant sleep or eat, and dx with two diff types of cancer and has been feeling depressed felt this was since tues.  History of Present Illness: "Surgeon saw something in his lungs".  "Thinks it is 2 kinds of cancer".  Sound like he may have had a bx and is to f/u with pulmonology next week to discuss the results.   Ever since then has been very anxious, cant sleep or eat,.  Has been feeling depressed  since tues.  He is very worried about the potential outcome.   Has appt next week with his pulmonologist, [facility].  His daughter is helping to coordinate his care and his sister takes care of his medications for him. He has been using the xanax but feels they are no longer working. Has been on them for years.   Current Medications (verified): 1)  Simvastatin 20 Mg Tabs (Simvastatin) .... Take 1 Tab By Mouth At Bedtime 2)  Omeprazole 20 Mg Cpdr (Omeprazole) .... Take 1 Tab By Mouth Once Daily 3)  Amlodipine Besylate 10 Mg Tabs (Amlodipine Besylate) .... Take 1 Tab By Mouth Once Daily 4)  Doxazosin Mesylate 4 Mg Tabs (Doxazosin Mesylate) .... Take 2 Tabs By Mouth Once Daily 5)  Hydrochlorothiazide 25 Mg Tabs (Hydrochlorothiazide) .... Take 1/2  Tab By Mouth Once Daily 6)  Amitriptyline Hcl 25 Mg Tabs (Amitriptyline Hcl) .... Take 2 Tabs By Mouth At Bedtime 7)  Alprazolam 0.25 Mg Tabs (Alprazolam) .Marland Kitchen.. 1 Tab By Mouth Two Times A Day As Needed Anxiety 8)  Proair Hfa 108 (90 Base) Mcg/act Aers (Albuterol Sulfate) .... 2 Puffs 4 X  A Day As Needed For Shortness of Breath 9)  Freestyle Test  Strp (Glucose Blood) .... Use As Directed To Test Blood Sugar Once Daily 10)  Humidified Oxygen Via Nasal Canula .Marland Kitchen.. 2l/min Dx; Copd With Nocturnal Hypoxemia 11)  Nebulizer Machine .... Dx: Copd Send To [facility] 12)  Duoneb 0.5-2.5 (3) Mg/4ml Soln (Ipratropium-Albuterol) .... 3 Ml Nebs 4 X A Day 13)  Fenofibrate 160 Mg Tabs (Fenofibrate) .Marland Kitchen.. 1 Tab By Mouth Daily  Allergies (verified): No Known Drug Allergies  Comments:  Nurse/Medical Assistant: The patient's medications and allergies were reviewed with the patient and were updated in the Medication and Allergy Lists. Avon Gully CMA, Duncan Dull) (February 25, 2010 2:38 PM)  Past History:  Family History: Last updated: Nov 02, 2009 mother died of abdomina aneursym father died cancer brother died lung cancer 2 sisters healthy  Physical Exam  General:  Well-developed,well-nourished,in no acute distress; alert,appropriate and cooperative throughout examination Psych:  Oriented X3, normally interactive, and good eye contact.  Not very good historian. Unsure of full cognitive status.    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 Will inc his xanax to 0.5mg  and increase his amitryptiline to 75mg  for  sleep and mood. Can consider an SSRI.  F/U with Dr. Leonard Schwartz inone month and hopefully he will have a dx and a care plan through his pulm or oncology by then.   Complete Medication List: 1)  Simvastatin 20 Mg Tabs (Simvastatin) .... Take 1 tab by mouth at bedtime 2)  Omeprazole 20 Mg Cpdr (Omeprazole) .... Take 1 tab by mouth once daily 3)  Amlodipine Besylate 10 Mg Tabs (Amlodipine besylate) .... Take 1 tab by mouth once daily 4)  Doxazosin Mesylate 4 Mg Tabs (Doxazosin mesylate) .... Take 2 tabs by mouth once daily 5)  Hydrochlorothiazide 25 Mg Tabs  (Hydrochlorothiazide) .... Take 1/2  tab by mouth once daily 6)  Amitriptyline Hcl 75 Mg Tabs (Amitriptyline hcl) .... Take 1 tablet by mouth once a day at bedtime 7)  Alprazolam 0.5 Mg Tabs (Alprazolam) .... Take 1 tablet by mouth two times a day 8)  Proair Hfa 108 (90 Base) Mcg/act Aers (Albuterol sulfate) .... 2 puffs 4 x a day as needed for shortness of breath 9)  Freestyle Test Strp (Glucose blood) .... Use as directed to test blood sugar once daily 10)  Humidified Oxygen Via Nasal Canula  .Marland Kitchen.. 2l/min dx; copd with nocturnal hypoxemia 11)  Nebulizer Machine  .... Dx: copd send to [facility] 12)  Duoneb 0.5-2.5 (3) Mg/53ml Soln (Ipratropium-albuterol) .... 3 ml nebs 4 x a day 13)  Fenofibrate 160 Mg Tabs (Fenofibrate) .Marland Kitchen.. 1 tab by mouth daily Prescriptions: AMITRIPTYLINE HCL 75 MG TABS (AMITRIPTYLINE HCL) Take 1 tablet by mouth once a day at bedtime  #30 x 0   Entered and Authorized by:   Nani Gasser MD   Signed by:   Nani Gasser MD on 02/25/2010   Method used:   Electronically to        [facility] (retail)       [address]       [place], [place]  [address]       Ph: [phone]       Fax: [phone]   RxID:   [id number] ALPRAZOLAM 0.5 MG TABS (ALPRAZOLAM) Take 1 tablet by mouth two times a day  #60 x 0   Entered and Authorized by:   Nani Gasser MD   Signed by:   Nani Gasser MD on 02/25/2010   Method used:   Print then Give to Patient   RxID:   [id number]

## 2010-08-04 ENCOUNTER — Encounter: Payer: Self-pay | Admitting: Family Medicine

## 2010-08-05 NOTE — Miscellaneous (Signed)
Summary: Clinical Note/[facility]  Clinical Note/[facility]   Imported By: Lanelle Bal 07/02/2010 12:19:29  _____________________________________________________________________  External Attachment:    Type:   Image     Comment:   External Document

## 2010-08-05 NOTE — Miscellaneous (Signed)
Summary: Clinical Note/[facility]  Clinical Note/[facility]   Imported By: Lanelle Bal 07/29/2010 11:31:27  _____________________________________________________________________  External Attachment:    Type:   Image     Comment:   External Document

## 2010-08-05 NOTE — Miscellaneous (Signed)
Summary: Clinical Note/[facility]  Clinical Note/[facility]   Imported By: Lanelle Bal 06/21/2010 10:00:44  _____________________________________________________________________  External Attachment:    Type:   Image     Comment:   External Document

## 2010-08-06 NOTE — Letter (Signed)
Summary: Forms for Nebulizer & Oxygen/[employer]  Referral Request Forms/[employer]   Imported By: Lanelle Bal 12/14/2009 08:21:21  _____________________________________________________________________  External Attachment:    Type:   Image     Comment:   External Document

## 2010-08-12 ENCOUNTER — Encounter: Payer: Self-pay | Admitting: Family Medicine

## 2010-08-13 ENCOUNTER — Telehealth (INDEPENDENT_AMBULATORY_CARE_PROVIDER_SITE_OTHER): Payer: Self-pay | Admitting: *Deleted

## 2010-08-16 ENCOUNTER — Ambulatory Visit (INDEPENDENT_AMBULATORY_CARE_PROVIDER_SITE_OTHER): Payer: Medicare PPO | Admitting: Family Medicine

## 2010-08-16 ENCOUNTER — Encounter: Payer: Self-pay | Admitting: Family Medicine

## 2010-08-16 DIAGNOSIS — N259 Disorder resulting from impaired renal tubular function, unspecified: Secondary | ICD-10-CM

## 2010-08-16 DIAGNOSIS — I10 Essential (primary) hypertension: Secondary | ICD-10-CM

## 2010-08-16 DIAGNOSIS — D509 Iron deficiency anemia, unspecified: Secondary | ICD-10-CM

## 2010-08-16 DIAGNOSIS — C3490 Malignant neoplasm of unspecified part of unspecified bronchus or lung: Secondary | ICD-10-CM

## 2010-08-16 DIAGNOSIS — N183 Chronic kidney disease, stage 3 (moderate): Secondary | ICD-10-CM

## 2010-08-17 ENCOUNTER — Encounter: Payer: Self-pay | Admitting: Family Medicine

## 2010-08-17 ENCOUNTER — Telehealth (INDEPENDENT_AMBULATORY_CARE_PROVIDER_SITE_OTHER): Payer: Self-pay | Admitting: *Deleted

## 2010-08-17 LAB — CONVERTED CEMR LAB
AST: 15 units/L (ref 0–37)
Albumin: 4.5 g/dL (ref 3.5–5.2)
BUN: 19 mg/dL (ref 6–23)
Basophils Absolute: 0.1 10*3/uL (ref 0.0–0.1)
CO2: 25 meq/L (ref 19–32)
Chloride: 100 meq/L (ref 96–112)
Eosinophils Relative: 4 % (ref 0–5)
Glucose, Bld: 125 mg/dL — ABNORMAL HIGH (ref 70–99)
Hemoglobin: 13.9 g/dL (ref 13.0–17.0)
Lymphocytes Relative: 12 % (ref 12–46)
MCV: 89.6 fL (ref 78.0–100.0)
Total Bilirubin: 0.2 mg/dL — ABNORMAL LOW (ref 0.3–1.2)
Total Protein: 7 g/dL (ref 6.0–8.3)

## 2010-08-19 NOTE — Progress Notes (Signed)
Summary: Hospice Services  Phone Note From Other Clinic Call back at [phone]   Caller: Nurse Summary of Call: Daniellel with Hospice, needs verbal order that patient can continue to receive Hospice services Initial call taken by: Lannette Donath,  August 13, 2010 10:03 AM  Follow-up for Phone Call        Verbal order given. Follow-up by: Payton Spark CMA,  August 13, 2010 10:07 AM

## 2010-08-24 ENCOUNTER — Encounter: Payer: Self-pay | Admitting: Family Medicine

## 2010-08-25 NOTE — Assessment & Plan Note (Signed)
Summary: Zachary Rios   Vital Signs:  Patient profile:   75 year old male Height:      69 inches Weight:      139 pounds BMI:     20.60 O2 Sat:      98 % on Room air Pulse rate:   89 / minute BP sitting:   135 / 64  (left arm) Cuff size:   regular  Vitals Entered By: Payton Spark CMA (August 16, 2010 10:22 AM)  O2 Flow:  Room air CC: Rios.    Primary Care Provider:  Seymour Bars DO  CC:  Rios. Marland Kitchen  History of Present Illness: 75 yo WM presents for Zachary Rios visit.    Zachary Rios is followed by Dr Mathis Bud for poorly differentiated squamous cell lung carcinoma, s/p resection Aug 2011 and also for his profound iron def anemia, on IV iron, felt to be from PUD.  At his visit Zachary mos ago, he agreed to a hospice consult and he is now under their care.  He feels like his energy level is slightly better.  he denies abd pain but gets chest tightness, SOB and wheezing esp when he goes outside.  On O2 at night for his COPD.  He is continuing to smoke, though has cut back.  He is using DuoNebs 2 x a day on avg and carries his ProAir inhaler with him.  Dr Ether Griffins does not have him on any COPD maintenance meds.    Current Medications (verified): 1)  Simvastatin 20 Mg Tabs (Simvastatin) .... Take 1 Tab By Mouth At Bedtime 2)  Amitriptyline Hcl 75 Mg Tabs (Amitriptyline Hcl) .... Take 1 Tablet By Mouth Once A Day At Bedtime Zachary)  Restoril 15 Mg Caps (Temazepam) .Marland Kitchen.. 1 Capsule By Mouth At Bedtime As Needed Sleep 4)  Proair Hfa 108 (90 Base) Mcg/act Aers (Albuterol Sulfate) .... 2 Puffs 4 X A Day As Needed For Shortness of Breath 5)  Freestyle Test  Strp (Glucose Blood) .... Use As Directed To Test Blood Sugar Once Daily 6)  Humidified Oxygen Via Nasal Canula .Marland Kitchen.. 2l/min Dx; Copd With Nocturnal Hypoxemia 7)  Nebulizer Machine .... Dx: Copd Send To [facility] 8)  Duoneb 0.5-2.5 (Zachary) Mg/35ml Soln (Ipratropium-Albuterol) .... Zachary Ml Nebs 4 X A Day 9)  Alprazolam 0.5 Mg Tabs (Alprazolam) .... 1/2 To 1 Tab By  Mouth Daily As Needed For Anxiety 10)  Hydrocodone-Acetaminophen 5-325 Mg Tabs (Hydrocodone-Acetaminophen) .Marland Kitchen.. 1-2 Tabs By Mouth Three Times A Day As Needed Severe Pain 11)  Poly-Iron 150 150 Mg Caps (Polysaccharide Iron Complex) .... Take 1 Cap By Mouth Two Times A Day 12)  Omeprazole 40 Mg Cpdr (Omeprazole) .Marland Kitchen.. 1 Tab By Mouth Qam 13)  Colace 100 Mg Caps (Docusate Sodium) 14)  Miralax  Powd (Polyethylene Glycol 3350) 15)  Doxazosin Mesylate 4 Mg Tabs (Doxazosin Mesylate) .... Take 1 Tab By Mouth At Bedtime 16)  Hydrochlorothiazide 25 Mg Tabs (Hydrochlorothiazide) .... Take 1 Tab By Mouth Once Daily 17)  Norvasc 10 Mg Tabs (Amlodipine Besylate) .... Take 1 Tab By Mouth Once Daily  Allergies (verified): No Known Drug Allergies  Past History:  Past Medical History: anxiety HTN DM High cholesterol  PUD COPD, on home O2 severe iron def anemia PAD- WS CARDS Stage IA RUL squamous cell carcinoma 02-2010  GI Dr Jason Fila CVTS Dr Lequita Halt pulm Dr Ether Griffins  Past Surgical History: Reviewed history from 03/26/2010 and no changes required. EGD 2010/ colonoscopy 2010 --> mutiple small  gastric ulcers R thoracotomy/ RUL resection for lung cancer 02-2010  Social History: Reviewed history from 10/07/2009 and no changes required. Retired from [employer]. Married.  Has Zachary daughters all local. Smokes 1 ppd Walks some. Wt stable. Denies ETOH.  Review of Systems      See HPI  Physical Exam  General:  alert.  in NAD, frail appearing Head:  normocephalic and atraumatic.   Eyes:  conjunctiva clear with pallor Mouth:  dry cracked, tobacco stained lips with mostly dry pink oral mucosa Neck:  no masses.   Lungs:  mildly labored at rest, prolonged exp phase with exp wheezing and rhonchi Heart:  normal rate and regular rhythm.  no audible murmurs Abdomen:  soft, non-tender, and no distention.   Extremities:  no LE edema Skin:  color normal.   Psych:  good eye contact, not anxious appearing, and  not depressed appearing.     Impression & Recommendations:  Problem # 1:  MALIGNANT NEOPLASM BRONCHUS&LUNG UNSPEC SITE (ICD-162.9) Stable s/p RUL resection in Aug 2011, managed by Dr Abbe Amsterdam. encouraged smoking cessation. He is set up to have CT chest, abd and bone scan in May.  Problem # 2:  COPD (ICD-496) SOB, chest tightness, wheezing all from his COPD.  I will ask Dr Ether Griffins to review his chart to see if adding an inhaled steroid / LABA would help.  On O2 at night.  Unfortunately, still smoking. His updated medication list for this problem includes:    Proair Hfa 108 (90 Base) Mcg/act Aers (Albuterol sulfate) .Marland Kitchen... 2 puffs 4 x a day as needed for shortness of breath    Duoneb 0.5-2.5 (Zachary) Mg/66ml Soln (Ipratropium-albuterol) .Marland KitchenMarland KitchenMarland KitchenMarland Kitchen Zachary ml nebs 4 x a day  Problem # Zachary:  ANEMIA, IRON DEFICIENCY (ICD-280.9) Managed by Dr Abbe Amsterdam, s/p IV infusions (x Zachary?).  I will include a CBC with his labs and obtain notes/ labs from her office to follow along.   His updated medication list for this problem includes:    Poly-iron 150 150 Mg Caps (Polysaccharide iron complex) .Marland Kitchen... Take 1 cap by mouth two times a day  Orders: T-CBC w/Diff ([id number])  Problem # 4:  RENAL INSUFFICIENCY (ICD-588.9) Recheck CMP today. Orders: T-Comprehensive Metabolic Panel ([id number])  Problem # 5:  ESSENTIAL HYPERTENSION, BENIGN (ICD-401.1) BP at goal.  Continue current meds and closely monitor renal function. His updated medication list for this problem includes:    Doxazosin Mesylate 4 Mg Tabs (Doxazosin mesylate) .Marland Kitchen... Take 1 tab by mouth at bedtime    Hydrochlorothiazide 25 Mg Tabs (Hydrochlorothiazide) .Marland Kitchen... Take 1 tab by mouth once daily    Norvasc 10 Mg Tabs (Amlodipine besylate) .Marland Kitchen... Take 1 tab by mouth once daily  BP today: 135/64 Prior BP: 117/54 (05/14/2010)  Labs Reviewed: Creat: : 1.74 (11/27/2009)     Problem # 6:  HYPERCHOLESTEROLEMIA (ICD-272.0)  His updated medication list for this problem  includes:    Simvastatin 20 Mg Tabs (Simvastatin) .Marland Kitchen... Take 1 tab by mouth at bedtime  Complete Medication List: 1)  Simvastatin 20 Mg Tabs (Simvastatin) .... Take 1 tab by mouth at bedtime 2)  Amitriptyline Hcl 75 Mg Tabs (Amitriptyline hcl) .... Take 1 tablet by mouth once a day at bedtime Zachary)  Restoril 15 Mg Caps (Temazepam) .Marland Kitchen.. 1 capsule by mouth at bedtime as needed sleep 4)  Proair Hfa 108 (90 Base) Mcg/act Aers (Albuterol sulfate) .... 2 puffs 4 x a day as needed for shortness of breath 5)  Freestyle Test Strp (  Glucose blood) .... Use as directed to test blood sugar once daily 6)  Humidified Oxygen Via Nasal Canula  .Marland Kitchen.. 2l/min dx; copd with nocturnal hypoxemia 7)  Nebulizer Machine  .... Dx: copd send to [facility] 8)  Duoneb 0.5-2.5 (Zachary) Mg/63ml Soln (Ipratropium-albuterol) .... Zachary ml nebs 4 x a day 9)  Alprazolam 0.5 Mg Tabs (Alprazolam) .... 1/2 to 1 tab by mouth daily as needed for anxiety 10)  Hydrocodone-acetaminophen 5-325 Mg Tabs (Hydrocodone-acetaminophen) .Marland Kitchen.. 1-2 tabs by mouth three times a day as needed severe pain 11)  Poly-iron 150 150 Mg Caps (Polysaccharide iron complex) .... Take 1 cap by mouth two times a day 12)  Omeprazole 40 Mg Cpdr (Omeprazole) .Marland Kitchen.. 1 tab by mouth qam 13)  Colace 100 Mg Caps (Docusate sodium) 14)  Miralax Powd (Polyethylene glycol 3350) 15)  Doxazosin Mesylate 4 Mg Tabs (Doxazosin mesylate) .... Take 1 tab by mouth at bedtime 16)  Hydrochlorothiazide 25 Mg Tabs (Hydrochlorothiazide) .... Take 1 tab by mouth once daily 17)  Norvasc 10 Mg Tabs (Amlodipine besylate) .... Take 1 tab by mouth once daily  Patient Instructions: 1)  Update labs downstairs today. 2)  I will contact Dr Abbe Amsterdam and Dr Ether Griffins and provide them copies. Zachary)  keep working on smoking cessation. 4)  Return for follow up in 4 mos.   Orders Added: 1)  T-Comprehensive Metabolic Panel [[id number]] 2)  T-CBC w/Diff [[id number]] Zachary)  Est. Patient Level IV [14782]

## 2010-08-25 NOTE — Progress Notes (Signed)
Summary: Zachary Rios-visit question  Phone Note Call from Patient Call back at [phone]   Caller: daughter-Kay Summary of Call: pt saw Dr Cathey Endow yesterday and daughter would like follow up. Initial call taken by: Francee Piccolo CMA Duncan Dull),  August 17, 2010 11:20 AM  Follow-up for Phone Call        [facility] informing daughter about OV Follow-up by: Payton Spark CMA,  August 17, 2010 11:38 AM

## 2010-08-25 NOTE — Miscellaneous (Signed)
Summary: Hospice Clinical Notes  Hospice Clinical Notes   Imported By: Kassie Mends 08/19/2010 08:47:58  _____________________________________________________________________  External Attachment:    Type:   Image     Comment:   External Document

## 2010-08-27 ENCOUNTER — Encounter: Payer: Self-pay | Admitting: Family Medicine

## 2010-08-31 NOTE — Miscellaneous (Signed)
Summary: Clinical Note/[facility]  Clinical Note/[facility]   Imported By: Lanelle Bal 08/25/2010 13:31:07  _____________________________________________________________________  External Attachment:    Type:   Image     Comment:   External Document

## 2010-08-31 NOTE — Consult Note (Signed)
Summary: [facility]   Imported By: Lanelle Bal 08/26/2010 11:07:48  _____________________________________________________________________  External Attachment:    Type:   Image     Comment:   External Document

## 2010-08-31 NOTE — Miscellaneous (Signed)
Summary: stop HCTZ  Clinical Lists Changes  Medications: Removed medication of HYDROCHLOROTHIAZIDE 25 MG TABS (HYDROCHLOROTHIAZIDE) Take 1 tab by mouth once daily

## 2010-09-21 NOTE — Miscellaneous (Signed)
Summary: Clinical Notes/[facility]  Clinical Notes/[facility]   Imported By: Maryln Gottron 09/14/2010 11:37:20  _____________________________________________________________________  External Attachment:    Type:   Image     Comment:   External Document

## 2010-09-24 ENCOUNTER — Other Ambulatory Visit: Payer: Self-pay | Admitting: Family Medicine

## 2010-09-24 DIAGNOSIS — F329 Major depressive disorder, single episode, unspecified: Secondary | ICD-10-CM

## 2010-09-24 DIAGNOSIS — I10 Essential (primary) hypertension: Secondary | ICD-10-CM

## 2010-09-24 DIAGNOSIS — E785 Hyperlipidemia, unspecified: Secondary | ICD-10-CM

## 2010-10-01 ENCOUNTER — Other Ambulatory Visit: Payer: Self-pay | Admitting: Family Medicine

## 2010-10-01 DIAGNOSIS — F419 Anxiety disorder, unspecified: Secondary | ICD-10-CM

## 2010-10-04 ENCOUNTER — Other Ambulatory Visit: Payer: Self-pay | Admitting: Family Medicine

## 2010-10-25 ENCOUNTER — Telehealth: Payer: Self-pay | Admitting: *Deleted

## 2010-10-25 NOTE — Telephone Encounter (Signed)
Pt called stating he needed to be seen bc he feels like his hgb is low again. I advised Pt to go to ED today due to his hx of blood transfusions.

## 2010-10-27 ENCOUNTER — Encounter: Payer: Self-pay | Admitting: Family Medicine

## 2010-10-27 ENCOUNTER — Ambulatory Visit (INDEPENDENT_AMBULATORY_CARE_PROVIDER_SITE_OTHER): Payer: Medicare PPO | Admitting: Family Medicine

## 2010-10-27 DIAGNOSIS — R229 Localized swelling, mass and lump, unspecified: Secondary | ICD-10-CM

## 2010-10-27 DIAGNOSIS — K279 Peptic ulcer, site unspecified, unspecified as acute or chronic, without hemorrhage or perforation: Secondary | ICD-10-CM

## 2010-10-27 DIAGNOSIS — R609 Edema, unspecified: Secondary | ICD-10-CM

## 2010-10-27 DIAGNOSIS — C3490 Malignant neoplasm of unspecified part of unspecified bronchus or lung: Secondary | ICD-10-CM

## 2010-10-27 DIAGNOSIS — J449 Chronic obstructive pulmonary disease, unspecified: Secondary | ICD-10-CM

## 2010-10-27 DIAGNOSIS — D509 Iron deficiency anemia, unspecified: Secondary | ICD-10-CM

## 2010-10-27 NOTE — Assessment & Plan Note (Signed)
COPD -actively wheezing on exam.  Using home nebs but not on a regular basis.  He is using his O2 which has helped.  Under hospice care for both his COPD and hx of lung cancer.

## 2010-10-27 NOTE — Assessment & Plan Note (Signed)
Will check CBC with iron levels.  He is currently on oral iron and has long hx of needing IV iron.  Has f/u with Dr Abbe Amsterdam in 2 wks.  Will send her a copy of his results.  He is not acutely having fatigue, dyspnea or pallor as he did in the past.  Swelling of the face may be from CHF, thyroid d/o etc.  Will rule these out with labs today.

## 2010-10-27 NOTE — Patient Instructions (Signed)
Labs downstairs today. Will call you w/ results tomorrow and I will fax a copy to Dr Abbe Amsterdam for your upcoming visit with her.

## 2010-10-27 NOTE — Assessment & Plan Note (Signed)
Asymptomatic.  Felt to be the cause for his chronic iron deficiency.  He did hemoccult + in the ED and has dark stools from oral iron.  Not having any hematochezia.  I will hold off on sending him back to GI since he is asymptomatic at this point.  If he does see red blood or has abd pain, will get him back in.

## 2010-10-27 NOTE — Progress Notes (Signed)
  Subjective:    Patient ID: Zachary Rios, male    DOB: 11-04-1935, 75 y.o.   MRN: [id number]  HPI  75 yo WM presents for f/u visit.  He went to the ED this wk after he felt like the bags under his eyes were swollen.  In the past, everytime this happened, it meant his iron was low.  He is on oral iron and sees Dr Abbe Amsterdam back May 7th.  Has been getting iron infusions for severe iron def secondary to PUD.  Denies melena or hematochezia. Denies swelling in his legs or hands, increase in chronic dyspnea, chest tightness or problems voiding.  He is still using noctornal O2 for his COPD with nebs prn.  He has a hx of lung cancer, s/p resection.  In the ED, his Hgb was 12 and he was hemoccult +.  BP 128/58  Pulse 86  Ht 5\' 9"  (1.753 m)  Wt 142 lb (64.411 kg)  BMI 20.97 kg/m2  SpO2 97%  Review of Systems  Constitutional: Negative for fever, activity change, appetite change, fatigue and unexpected weight change.  HENT: Positive for facial swelling. Negative for congestion.   Eyes: Negative for visual disturbance.  Respiratory: Positive for cough and wheezing. Negative for chest tightness and shortness of breath.   Cardiovascular: Negative for chest pain, palpitations and leg swelling.  Gastrointestinal: Negative for nausea, vomiting, diarrhea, constipation, blood in stool and anal bleeding.  Genitourinary: Negative for difficulty urinating.  Musculoskeletal: Negative for arthralgias.  Skin: Negative for color change.  Neurological: Negative for light-headedness.  Psychiatric/Behavioral: Negative for dysphoric mood. The patient is not nervous/anxious.        Objective:   Physical Exam  Constitutional: He is oriented to person, place, and time. He appears well-developed and well-nourished. No distress.  HENT:  Head: Normocephalic and atraumatic.    Mouth/Throat: Mucous membranes are not dry.       Bags under both eyes Oral mucosa try with tobacco stains  Neck: Neck supple. No JVD present. No  thyromegaly present.  Cardiovascular: Normal rate, regular rhythm and normal heart sounds.   Pulmonary/Chest: Not tachypneic. No respiratory distress. He has wheezes in the right upper field, the right lower field, the left upper field and the left lower field. He has rhonchi in the right lower field and the left lower field.  Abdominal: He exhibits no distension and no mass. There is no tenderness. There is no rebound and no guarding.  Musculoskeletal: He exhibits no edema.  Lymphadenopathy:    He has no cervical adenopathy.  Neurological: He is alert and oriented to person, place, and time.  Skin: Skin is warm and dry. No pallor.  Psychiatric: He has a normal mood and affect.          Assessment & Plan:

## 2010-10-28 ENCOUNTER — Telehealth: Payer: Self-pay | Admitting: Family Medicine

## 2010-10-28 LAB — COMPLETE METABOLIC PANEL WITH GFR
Albumin: 4.8 g/dL (ref 3.5–5.2)
Alkaline Phosphatase: 66 U/L (ref 39–117)
GFR, Est African American: >60 mL/min (ref >60)
Total Protein: 7.1 g/dL (ref 6.0–8.3)

## 2010-10-28 LAB — CBC WITH DIFFERENTIAL/PLATELET
Basophils Relative: 1 % (ref 0–1)
Eosinophils Relative: 5 % (ref 0–5)
Lymphs Abs: 1.3 10*3/uL (ref 0.7–4.0)
Monocytes Absolute: 0.5 10*3/uL (ref 0.1–1.0)
Monocytes Relative: 6 % (ref 3–12)
Platelets: 295 10*3/uL (ref 150–400)
WBC: 8.2 10*3/uL (ref 4.0–10.5)

## 2010-10-28 LAB — TSH: TSH: 2.232 u[IU]/mL (ref 0.350–4.500)

## 2010-10-28 LAB — FERRITIN: Ferritin: 504 ng/mL — ABNORMAL HIGH (ref 22–322)

## 2010-10-28 LAB — BRAIN NATRIURETIC PEPTIDE: Brain Natriuretic Peptide: 7.7 pg/mL (ref 0.0–100.0)

## 2010-10-28 NOTE — Telephone Encounter (Signed)
Pls let pt know that his iron level and blood counts came back normal.  Thyroid function is normal and kidney function looks great.  No sign of congestive heart failure but his sugar was HIGH at 191.  See if he can come in Fri or Mon for a nurse visit to check an AM fasting CBG in the office with an A1C.

## 2010-10-28 NOTE — Telephone Encounter (Signed)
No answer. Mailbox full unable to LM

## 2010-10-29 ENCOUNTER — Other Ambulatory Visit: Payer: Self-pay | Admitting: Family Medicine

## 2010-11-01 NOTE — Telephone Encounter (Signed)
Pt aware of the above  

## 2010-11-02 ENCOUNTER — Other Ambulatory Visit (INDEPENDENT_AMBULATORY_CARE_PROVIDER_SITE_OTHER): Payer: Medicare PPO | Admitting: Family Medicine

## 2010-11-02 ENCOUNTER — Telehealth: Payer: Self-pay | Admitting: Family Medicine

## 2010-11-02 DIAGNOSIS — R7301 Impaired fasting glucose: Secondary | ICD-10-CM

## 2010-11-02 NOTE — Telephone Encounter (Signed)
Pls let pt know that his A1c is 6.4.  6.5 is the cut off for diabetes.  I'd like him to work on a low sugar diet and recheck this in 4 mos.

## 2010-11-02 NOTE — Telephone Encounter (Signed)
Pt aware of the above  

## 2010-11-02 NOTE — Progress Notes (Signed)
Pls let pt know that his A1C is 6.4.  6.5 is the cut off for Type 2 Diabetes.  I'd like to recheck him in 4 mos.  Work on low sugar diet.

## 2010-11-03 ENCOUNTER — Telehealth: Payer: Self-pay | Admitting: Family Medicine

## 2010-11-03 ENCOUNTER — Other Ambulatory Visit: Payer: Self-pay | Admitting: Family Medicine

## 2010-11-03 NOTE — Telephone Encounter (Signed)
Trish with Hospice called and would like copy of OV 10-27-10 faxed to [phone]. PLAN:  Faxed office note as requested. Jarvis Newcomer, LPN [facility]

## 2010-11-10 ENCOUNTER — Other Ambulatory Visit: Payer: Self-pay | Admitting: Family Medicine

## 2010-11-11 ENCOUNTER — Other Ambulatory Visit: Payer: Self-pay | Admitting: Family Medicine

## 2010-11-12 ENCOUNTER — Telehealth: Payer: Self-pay | Admitting: Family Medicine

## 2010-11-12 NOTE — Telephone Encounter (Signed)
Lamont Snowball, Rn with Hospice called to let Dr. Cathey Endow know that pt is no longer appropriate for the care from Hospice.  Medical director has warranted that pt no longer meets the guidelines to continue services at this point but if things change in the future then they can be re-contacted to re-start servs.  Will discharge on 11-13-10.  They waited on the MRI  For final decision.  Nurse would like a call to confirm Dr. Cathey Endow got this important message.  Plan:  Routed to Dr. Cathey Endow

## 2010-11-13 NOTE — Telephone Encounter (Signed)
Pls call Zachary Rios back and give her a verbal that I received this message on [facility] and am comfortable following his care in the office.

## 2010-11-15 NOTE — Telephone Encounter (Signed)
Tammy aware of the above

## 2010-11-20 ENCOUNTER — Other Ambulatory Visit: Payer: Self-pay | Admitting: Family Medicine

## 2010-11-22 ENCOUNTER — Other Ambulatory Visit: Payer: Self-pay | Admitting: Family Medicine

## 2010-11-22 NOTE — Telephone Encounter (Signed)
Request received via fax for Temazepam 15 mg cap.  Rec from [facility].  Fax says no longer in hospice care and e-scribed wrong dose.  Hand wrote over the original 15 mg from pharm.  They changed to 30 mg cap.  Please advise what dose  Is correct.  I am seeing the 15 mg in the system. Plan:  Routed to Dr. Arlice Colt, LPN [facility]

## 2010-11-23 ENCOUNTER — Encounter: Payer: Self-pay | Admitting: Family Medicine

## 2010-11-23 ENCOUNTER — Telehealth: Payer: Self-pay | Admitting: Family Medicine

## 2010-11-23 DIAGNOSIS — G4720 Circadian rhythm sleep disorder, unspecified type: Secondary | ICD-10-CM

## 2010-11-23 MED ORDER — TEMAZEPAM 15 MG PO CAPS: 15.0000 mg | ORAL_CAPSULE | Freq: Every evening | ORAL | Status: DC | PRN

## 2010-11-23 NOTE — Telephone Encounter (Signed)
Prescription refill for temazepam 15 mg # 30/3 refills sent to [facility] via fax transmittal. Jarvis Newcomer, LPN [facility]

## 2010-11-23 NOTE — Telephone Encounter (Signed)
Fine to RF Temazepam 15 mg qhs.  #30 with 3 RFs to [facility].

## 2010-12-02 NOTE — Telephone Encounter (Signed)
Closed encounter. Lecretia Buczek, LPN /Triage  

## 2010-12-15 ENCOUNTER — Ambulatory Visit (INDEPENDENT_AMBULATORY_CARE_PROVIDER_SITE_OTHER): Payer: Medicare PPO | Admitting: Family Medicine

## 2010-12-15 ENCOUNTER — Encounter: Payer: Self-pay | Admitting: Family Medicine

## 2010-12-15 VITALS — BP 135/59 | HR 77 | Ht 69.0 in | Wt 137.0 lb

## 2010-12-15 DIAGNOSIS — C3490 Malignant neoplasm of unspecified part of unspecified bronchus or lung: Secondary | ICD-10-CM

## 2010-12-15 DIAGNOSIS — I10 Essential (primary) hypertension: Secondary | ICD-10-CM

## 2010-12-15 DIAGNOSIS — J449 Chronic obstructive pulmonary disease, unspecified: Secondary | ICD-10-CM

## 2010-12-15 DIAGNOSIS — R7301 Impaired fasting glucose: Secondary | ICD-10-CM

## 2010-12-15 DIAGNOSIS — D509 Iron deficiency anemia, unspecified: Secondary | ICD-10-CM

## 2010-12-15 LAB — GLUCOSE, POCT (MANUAL RESULT ENTRY): POC Glucose: 118

## 2010-12-15 MED ORDER — AMLODIPINE BESYLATE 5 MG PO TABS: 5.0000 mg | ORAL_TABLET | Freq: Every day | ORAL | Status: DC

## 2010-12-15 NOTE — Patient Instructions (Signed)
Cut Norvasc (Amlodopine) to 5 mg once daily.  Call the Pharmacy if you need RFs on any of your meds.  Use OTC MIRALAX daily for prevention of constipation.  Congratulations on quitting smoking.  Sugar is 118 - OK.  Let me know if you need anything.  Follow  Up in 3 months.

## 2010-12-15 NOTE — Assessment & Plan Note (Addendum)
Reviewed his plan.  He is seeing Dr Ether Griffins for pulm care and recently had a L Lung Biopsy about 2 wks ago and required a chest tube.  I will go over his notes.  Plans to start radiation soon.  Breathing stable, using vicodin for pain.  Added DAILY miralax for constipating SEs.  Has a good support system and has oncology f/u.

## 2010-12-15 NOTE — Progress Notes (Signed)
  Subjective:    Patient ID: Zachary Rios, male    DOB: Feb 10, 1936, 75 y.o.   MRN: [id number]  HPI  75 yo WM presents for f/u visit.  Since I last saw him, he had a L lung biopsy, complicated by a pneumothorax, needing a chest tube about 2 wks ago.  He is healing up and waiting for his path results.  He sees Dr Ether Griffins, pulm for this and his COPD on nightly oxygen.  Denies any change in dyspnea or cough and did quit smoking 3 wks ago.  Denies chest tightness, leg swelling.  His last Hemoglobin was good with a hx of iron def anemia from PUD.  He still sees Dr Abbe Amsterdam for oncology and says the next step is to radiate the L lung.  He has a good support system.    Has been a little constipated,not using miralx daily but is taking vicodin for pain daily which helps.   He is no longer under hospice care.  BP 135/59  Pulse 77  Ht 5\' 9"  (1.753 m)  Wt 137 lb (62.143 kg)  BMI 20.23 kg/m2  SpO2 95%    Review of Systems  Constitutional: Negative for fever, chills, appetite change, fatigue and unexpected weight change.  HENT: Negative for sore throat.   Eyes: Negative for visual disturbance.  Respiratory: Positive for shortness of breath and wheezing. Negative for cough and chest tightness.   Cardiovascular: Negative for chest pain, palpitations and leg swelling.  Gastrointestinal: Positive for constipation. Negative for nausea, vomiting, abdominal pain and diarrhea.  Genitourinary: Negative for difficulty urinating.  Neurological: Negative for weakness and headaches.  Hematological: Does not bruise/bleed easily.  Psychiatric/Behavioral: Negative for dysphoric mood.       Objective:   Physical Exam  Constitutional: He appears well-developed and well-nourished.       In NAD  HENT:  Head: Normocephalic and atraumatic.       O/p tobacco stained, dry mucosa  Eyes: Conjunctivae are normal. No scleral icterus.  Neck: Neck supple. No thyromegaly present.  Cardiovascular: Normal rate, regular rhythm  and normal heart sounds.   Pulmonary/Chest: No accessory muscle usage. Not tachypneic. No respiratory distress. He has decreased breath sounds in the right lower field and the left lower field. He has wheezes in the right upper field, the right middle field, the right lower field, the left upper field, the left middle field and the left lower field. He has no rales.  Abdominal: Bowel sounds are normal. He exhibits distension (mild). There is no tenderness. There is no guarding.  Musculoskeletal: He exhibits no edema.  Lymphadenopathy:    He has no cervical adenopathy.  Skin: Skin is warm and dry. No pallor.  Psychiatric: He has a normal mood and affect.          Assessment & Plan:

## 2010-12-16 NOTE — Assessment & Plan Note (Signed)
Stable with last Hgb > 11.

## 2010-12-16 NOTE — Assessment & Plan Note (Signed)
Fairly stable.  Congratulated him on quitting smoking.  On nocturnal O2, nebs and has f/u with Dr Ether Griffins.

## 2010-12-16 NOTE — Assessment & Plan Note (Signed)
DBP running a little low.  Will cut his Amlodopine to 5 mg/ day.  Given his age, will allow his SBP to run in the 140s.  Work on improving fluid intake.

## 2011-01-03 ENCOUNTER — Other Ambulatory Visit: Payer: Self-pay | Admitting: Family Medicine

## 2011-01-17 ENCOUNTER — Other Ambulatory Visit: Payer: Self-pay | Admitting: Family Medicine

## 2011-01-21 ENCOUNTER — Other Ambulatory Visit: Payer: Self-pay | Admitting: Family Medicine

## 2011-01-25 ENCOUNTER — Encounter: Payer: Self-pay | Admitting: Family Medicine

## 2011-01-25 ENCOUNTER — Ambulatory Visit (INDEPENDENT_AMBULATORY_CARE_PROVIDER_SITE_OTHER): Payer: Medicare PPO | Admitting: Family Medicine

## 2011-01-25 VITALS — BP 145/75 | HR 93 | Ht 69.0 in | Wt 142.0 lb

## 2011-01-25 DIAGNOSIS — R609 Edema, unspecified: Secondary | ICD-10-CM

## 2011-01-25 DIAGNOSIS — R600 Localized edema: Secondary | ICD-10-CM | POA: Insufficient documentation

## 2011-01-25 LAB — POCT URINALYSIS DIPSTICK
Bilirubin, UA: NEGATIVE
Ketones, UA: NEGATIVE
Leukocytes, UA: NEGATIVE
pH, UA: 7

## 2011-01-25 NOTE — Assessment & Plan Note (Addendum)
UA neg for proteinuria.  Will get labs - CMP, TSH and BNP today to look for source. Elevate legs, return HOB to normal spot and avoid high sodium foods.  If labs are normal, will add lasix x 3-5 days.

## 2011-01-25 NOTE — Patient Instructions (Signed)
UA today.  Labs today.  Will call you w/ results tomorrow.  Return head of bed to normal position.  Elevate legs during the day when at rest. Avoid high sodium foods.

## 2011-01-25 NOTE — Progress Notes (Signed)
  Subjective:    Patient ID: Zachary Rios, male    DOB: 04-Jan-1936, 75 y.o.   MRN: [id number]  HPI  75 yo WM presents for new onset bilat leg swelling x 2-3 days.  He is 5 lbs heavier than 1 month ago.  Denies SOB unless he is out in the sun.  He took prednisone last month.  He is undergoing radiation treatment for lung cancer.  His doctor yesterday during radiation noticed that he had leg swelling.  Denies problems voiding or new medications.  Denies orthopnea.  BP 145/75  Pulse 93  Ht 5\' 9"  (1.753 m)  Wt 142 lb (64.411 kg)  BMI 20.97 kg/m2  SpO2 94%   Review of Systems  Constitutional: Positive for unexpected weight change. Negative for fever and fatigue.  Eyes: Negative for visual disturbance.  Respiratory: Positive for shortness of breath.   Cardiovascular: Positive for leg swelling. Negative for chest pain and palpitations.  Genitourinary: Negative for decreased urine volume and difficulty urinating.  Neurological: Negative for dizziness and headaches.       Objective:   Physical Exam  Constitutional: He appears well-developed and well-nourished.  HENT:       O/p tobacco stained with fairly dry oral mucosa  Eyes: Conjunctivae are normal. No scleral icterus.  Neck: Neck supple. No JVD present. No thyromegaly present.  Cardiovascular: Normal rate, regular rhythm and normal heart sounds.   Pulmonary/Chest: Effort normal. No respiratory distress. He has wheezes.  Abdominal: Soft. He exhibits no distension. There is no tenderness.  Musculoskeletal: He exhibits edema (1+ pitting edema 1/2 up both legs bilat).  Lymphadenopathy:    He has no cervical adenopathy.  Skin: Skin is warm and dry.       No skin breakdown  Psychiatric: He has a normal mood and affect.          Assessment & Plan:

## 2011-01-26 ENCOUNTER — Telehealth: Payer: Self-pay | Admitting: Family Medicine

## 2011-01-26 LAB — COMPLETE METABOLIC PANEL WITH GFR
ALT: 10 U/L (ref 0–53)
CO2: 29 mEq/L (ref 19–32)
Calcium: 10 mg/dL (ref 8.4–10.5)
Chloride: 99 mEq/L (ref 96–112)
GFR, Est African American: >60 mL/min (ref >60)
Potassium: 4.6 mEq/L (ref 3.5–5.3)
Sodium: 138 mEq/L (ref 135–145)
Total Bilirubin: 0.4 mg/dL (ref 0.3–1.2)
Total Protein: 7.2 g/dL (ref 6.0–8.3)

## 2011-01-26 MED ORDER — FUROSEMIDE 20 MG PO TABS: 20.0000 mg | ORAL_TABLET | Freq: Every day | ORAL | Status: DC

## 2011-01-26 NOTE — Telephone Encounter (Signed)
Pt aware of the above  

## 2011-01-26 NOTE — Telephone Encounter (Signed)
Pls let pt know that his kidney and liver function came back normal.  Thyroid function is normal and no sign of congestive heart failure.  Will add Lasix 20 mg once daily x 5 days for swelling.  Avoid prolonged standing, elevate legs during the day and eat 1 banana/ day while on Lasix.   Call if swelling has not resolved in 5 days.  Lasix RX will be at pharmacy to pick up.

## 2011-02-12 ENCOUNTER — Other Ambulatory Visit: Payer: Self-pay | Admitting: Family Medicine

## 2011-02-15 ENCOUNTER — Encounter: Payer: Self-pay | Admitting: Family Medicine

## 2011-02-17 ENCOUNTER — Other Ambulatory Visit: Payer: Self-pay | Admitting: Family Medicine

## 2011-02-17 ENCOUNTER — Telehealth: Payer: Self-pay | Admitting: Family Medicine

## 2011-02-17 MED ORDER — HYDROCODONE-ACETAMINOPHEN 5-325 MG PO TABS: 2.0000 | ORAL_TABLET | Freq: Three times a day (TID) | ORAL | Status: DC | PRN

## 2011-02-17 NOTE — Telephone Encounter (Signed)
Pt called and said he needs refill of his norco 5/325 mg.  Last refill 06-29-10.  Last OV 01-25-11.   Plan:  Presciption printed off for provider to sign for #90/0 refills.  Told the pt to check with his pharm later on today. Jarvis Newcomer, LPN [facility]

## 2011-02-17 NOTE — Telephone Encounter (Signed)
Pt notified that Dr. Linford Arnold authorized.  In the future pt informed to get his cancer provider to write his pain medication.  Pt voiced  Understanding. Jarvis Newcomer, LPN [facility]

## 2011-02-17 NOTE — Telephone Encounter (Signed)
OK to fill this time, but after that really needs to get it from his cancer MD.

## 2011-02-17 NOTE — Telephone Encounter (Signed)
Pt called in this am and requesting refill on his norco 5/325 mg.  Printed off for # 90/0refills.  Dr. Linford Arnold prefers triage nurse to call the pt and ask why he is now needing the med when his last refill was in 06/2010.   Plan:  Pt called and he said he just got done with radiation, and he does see a cancer doctor but could not answer my question when he was asked the name of the cancer doctor.  Called the pharmacy and they said the last script that was filled for the medication was on 10-30-10 for #90/0 refills by Dr. Cathey Endow.  The time before that was in 06-2010 # 100 by Dr. Cathey Endow.  I do not see the 10-30-10 script in the system, but pharmacist verified.  Pt said hurting in his lower back since just finished the radiation. Routed to Dr. Linford Arnold for review and auth or the norco script. Jarvis Newcomer, LPN [facility]

## 2011-02-19 ENCOUNTER — Other Ambulatory Visit: Payer: Self-pay | Admitting: Family Medicine

## 2011-02-26 ENCOUNTER — Other Ambulatory Visit: Payer: Self-pay | Admitting: Family Medicine

## 2011-03-16 ENCOUNTER — Ambulatory Visit: Payer: Medicare PPO | Admitting: Family Medicine

## 2011-03-16 DIAGNOSIS — Z0289 Encounter for other administrative examinations: Secondary | ICD-10-CM

## 2011-03-30 ENCOUNTER — Encounter: Payer: Self-pay | Admitting: Family Medicine

## 2011-04-06 ENCOUNTER — Other Ambulatory Visit: Payer: Self-pay | Admitting: Family Medicine

## 2011-04-07 ENCOUNTER — Telehealth: Payer: Self-pay | Admitting: Family Medicine

## 2011-04-07 NOTE — Telephone Encounter (Signed)
Patient called and states that the powder his Hospice nurse had given him to get his bowels working is no longer working for him and he would like to know if you would call him in something to get his bowels moving. He uses Adult nurse and if there is a problem, he needs a phone call on his cell. Thanks for your help, Zachary Rios

## 2011-04-08 NOTE — Telephone Encounter (Signed)
Can try magnesium citrate. a day until bowel movement. Then go back to using the powder as the magnesium can't be used for more than 2 days in a a row.

## 2011-04-08 NOTE — Telephone Encounter (Signed)
Pt aware.

## 2011-04-12 ENCOUNTER — Other Ambulatory Visit: Payer: Self-pay | Admitting: Family Medicine

## 2011-04-12 DIAGNOSIS — E785 Hyperlipidemia, unspecified: Secondary | ICD-10-CM

## 2011-04-12 MED ORDER — SIMVASTATIN 20 MG PO TABS: 20.0000 mg | ORAL_TABLET | Freq: Every day | ORAL | Status: DC

## 2011-04-12 NOTE — Telephone Encounter (Signed)
Request received for simvastatin 20 mg. Plan:  Cannot see where pt has had fast lipid panel drawn.  Needs to do ASAP!Marland Kitchen.  Will only fill simvastatin medication for 30 day supply. Pharmacy notified to have pt schedule an office visit. Jarvis Newcomer, LPN [facility]

## 2011-04-16 ENCOUNTER — Other Ambulatory Visit: Payer: Self-pay | Admitting: Family Medicine

## 2011-04-30 ENCOUNTER — Other Ambulatory Visit: Payer: Self-pay | Admitting: Family Medicine

## 2011-05-16 ENCOUNTER — Ambulatory Visit (INDEPENDENT_AMBULATORY_CARE_PROVIDER_SITE_OTHER): Payer: Medicare PPO | Admitting: Family Medicine

## 2011-05-16 ENCOUNTER — Encounter: Payer: Self-pay | Admitting: Family Medicine

## 2011-05-16 VITALS — BP 113/45 | HR 95 | Wt 146.0 lb

## 2011-05-16 DIAGNOSIS — R609 Edema, unspecified: Secondary | ICD-10-CM

## 2011-05-16 DIAGNOSIS — E611 Iron deficiency: Secondary | ICD-10-CM

## 2011-05-16 DIAGNOSIS — R600 Localized edema: Secondary | ICD-10-CM

## 2011-05-16 DIAGNOSIS — E538 Deficiency of other specified B group vitamins: Secondary | ICD-10-CM

## 2011-05-16 DIAGNOSIS — D649 Anemia, unspecified: Secondary | ICD-10-CM

## 2011-05-16 MED ORDER — IPRATROPIUM-ALBUTEROL 0.5-2.5 (3) MG/3ML IN SOLN: 3.0000 mL | Freq: Four times a day (QID) | RESPIRATORY_TRACT | Status: AC | PRN

## 2011-05-16 MED ORDER — FUROSEMIDE 20 MG PO TABS: 20.0000 mg | ORAL_TABLET | Freq: Every day | ORAL | Status: DC

## 2011-05-16 NOTE — Patient Instructions (Signed)
Furosemide . Take one a day to help reduce the fluid in your face.  Call if not helping.

## 2011-05-16 NOTE — Progress Notes (Signed)
  Subjective:    Patient ID: Zachary Rios, male    DOB: Apr 28, 1936, 76 y.o.   MRN: [id number]  HPI  Noticed some swelling under his eyes. More puffy. Noticed it about a week ago. Some SOB but this is chronic. Says his iron is usually low when this happens. I asked him if he was still taking his iron supplement and he said he was not sure. He denies any fatigue or change in his symptoms. He does have a history of COPD and denies any increase in shortness of breath. He also has a history of a chronic cough and says this is not worse either. He denies any swelling in his lower extremities. He says he was given a fluid pill at one point in time for some ankle swelling. He said the medication helped and he has not had any problems with ankle swelling since. He denies any chest pain.  Review of Systems     Objective:   Physical Exam  Constitutional: He is oriented to person, place, and time. He appears well-developed and well-nourished.  HENT:  Head: Normocephalic and atraumatic.       He has bags of fluid under both eyes.   Neck: Neck supple. No thyromegaly present.  Cardiovascular: Normal rate, regular rhythm and normal heart sounds.   Pulmonary/Chest: Effort normal.       Coarse BS bilat.   Musculoskeletal: He exhibits no edema.  Lymphadenopathy:    He has no cervical adenopathy.  Neurological: He is alert and oriented to person, place, and time.  Skin: Skin is warm and dry.  Psychiatric: He has a normal mood and affect. His behavior is normal.          Assessment & Plan:  Facial edema-his heart exam is normal today. There no signs of extremity edema. Given a prescription for furosemide to use for a couple of days to help get the fluid off. His chest exam is at baseline with coarse breath sounds. He is to call if he gets any problems of shortness of breath or chest pain. His fingerstick hemoglobin here in the office was normal. I will check an iron level. I will also check his  electrolytes. He says he also used to be on. Vitamin B12 replacement but he has not done this in some time. I will check a vitamin B12 level today. Will call him with the results.

## 2011-05-17 LAB — IRON: Iron: 47 ug/dL (ref 42–165)

## 2011-05-17 LAB — TSH: TSH: 4.45 u[IU]/mL (ref 0.350–4.500)

## 2011-05-17 LAB — VITAMIN B12: Vitamin B-12: 365 pg/mL (ref 211–911)

## 2011-05-18 ENCOUNTER — Other Ambulatory Visit: Payer: Self-pay | Admitting: *Deleted

## 2011-05-18 LAB — COMPLETE METABOLIC PANEL WITH GFR
AST: 15 U/L (ref 0–37)
Alkaline Phosphatase: 71 U/L (ref 39–117)
BUN: 17 mg/dL (ref 6–23)
Creat: 1.43 mg/dL — ABNORMAL HIGH (ref 0.50–1.35)
Potassium: 4.3 mEq/L (ref 3.5–5.3)
Total Bilirubin: 0.4 mg/dL (ref 0.3–1.2)

## 2011-05-18 MED ORDER — TEMAZEPAM 15 MG PO CAPS: 15.0000 mg | ORAL_CAPSULE | Freq: Every evening | ORAL | Status: DC | PRN

## 2011-05-30 ENCOUNTER — Other Ambulatory Visit: Payer: Self-pay | Admitting: *Deleted

## 2011-05-30 MED ORDER — AMLODIPINE BESYLATE 5 MG PO TABS: 5.0000 mg | ORAL_TABLET | Freq: Every day | ORAL | Status: DC

## 2011-05-30 MED ORDER — OMEPRAZOLE 40 MG PO CPDR: 40.0000 mg | DELAYED_RELEASE_CAPSULE | Freq: Every day | ORAL | Status: DC

## 2011-05-31 MED ORDER — AMLODIPINE BESYLATE 5 MG PO TABS: 5.0000 mg | ORAL_TABLET | Freq: Every day | ORAL | Status: DC

## 2011-05-31 MED ORDER — OMEPRAZOLE 40 MG PO CPDR: 40.0000 mg | DELAYED_RELEASE_CAPSULE | Freq: Every day | ORAL | Status: DC

## 2011-05-31 NOTE — Telephone Encounter (Signed)
Addended by: Ellsworth Lennox on: 05/31/2011 04:46 PM   Modules accepted: Orders

## 2011-06-10 ENCOUNTER — Other Ambulatory Visit: Payer: Self-pay | Admitting: Family Medicine

## 2011-07-16 ENCOUNTER — Other Ambulatory Visit: Payer: Self-pay | Admitting: Family Medicine

## 2011-07-18 ENCOUNTER — Encounter: Payer: Self-pay | Admitting: Family Medicine

## 2011-07-18 ENCOUNTER — Ambulatory Visit (INDEPENDENT_AMBULATORY_CARE_PROVIDER_SITE_OTHER): Payer: Medicare PPO | Admitting: Family Medicine

## 2011-07-18 DIAGNOSIS — N289 Disorder of kidney and ureter, unspecified: Secondary | ICD-10-CM

## 2011-07-18 DIAGNOSIS — E119 Type 2 diabetes mellitus without complications: Secondary | ICD-10-CM

## 2011-07-18 DIAGNOSIS — J449 Chronic obstructive pulmonary disease, unspecified: Secondary | ICD-10-CM

## 2011-07-18 DIAGNOSIS — J4489 Other specified chronic obstructive pulmonary disease: Secondary | ICD-10-CM

## 2011-07-18 MED ORDER — AMBULATORY NON FORMULARY MEDICATION: Status: DC

## 2011-07-18 MED ORDER — GLIPIZIDE ER 5 MG PO TB24: 5.0000 mg | ORAL_TABLET | Freq: Every day | ORAL | Status: DC

## 2011-07-18 MED ORDER — ALPRAZOLAM 0.5 MG PO TABS: 0.5000 mg | ORAL_TABLET | Freq: Every evening | ORAL | Status: DC | PRN

## 2011-07-18 MED ORDER — AMLODIPINE BESYLATE 5 MG PO TABS: 5.0000 mg | ORAL_TABLET | Freq: Every day | ORAL | Status: DC

## 2011-07-18 MED ORDER — ALBUTEROL SULFATE (2.5 MG/3ML) 0.083% IN NEBU: 2.5000 mg | INHALATION_SOLUTION | Freq: Four times a day (QID) | RESPIRATORY_TRACT | Status: DC | PRN

## 2011-07-18 NOTE — Progress Notes (Signed)
  Subjective:    Patient ID: Zachary Rios, male    DOB: 1935-10-08, 76 y.o.   MRN: [id number]  HPI COPD - Follows with Oncology.  He is using his inhalers. He does needs  A RF on his neb solution.   Also here to go over his labs. Abnormal glucose and elevated kidney function.   Review of Systems     Objective:   Physical Exam  Constitutional: He is oriented to person, place, and time. He appears well-developed and well-nourished.  HENT:  Head: Normocephalic and atraumatic.  Cardiovascular: Normal rate, regular rhythm and normal heart sounds.   Pulmonary/Chest: Effort normal and breath sounds normal.       Coarse BS bilaterall.   Neurological: He is alert and oriented to person, place, and time.  Skin: Skin is warm and dry.  Psychiatric: He has a normal mood and affect. His behavior is normal.          Assessment & Plan:   DM - Discussed new diagnosis. Went over needed dietary changes.  Will start glipizide  Given rx for glucometer to test sugars twice a week before breakfast.  Then f/u in 6 weeks.  Will refer for diabetic and nutrition counseling. Avoid metformin bc of recent kidney function.   COPD - Consider spirometry. I don't know if oncology has done this or not.   Abnormal kidney function-his creatinine was elevated on the last lab check. Previously was normal. His BUN was normal so it does not appear that he was dehydrated. I like to recheck that today since it has been 2 months to make sure that it is back to normal. Or if it is persistently elevated then he will have a new diagnosis of chronic kidney disease stage III.  25 min spent face to face in counseling.

## 2011-07-19 LAB — BASIC METABOLIC PANEL
Potassium: 4.7 mEq/L (ref 3.5–5.3)
Sodium: 140 mEq/L (ref 135–145)

## 2011-07-29 ENCOUNTER — Other Ambulatory Visit: Payer: Self-pay | Admitting: *Deleted

## 2011-07-29 MED ORDER — PANTOPRAZOLE SODIUM 40 MG PO TBEC: 40.0000 mg | DELAYED_RELEASE_TABLET | Freq: Every day | ORAL | Status: DC

## 2011-07-29 NOTE — Telephone Encounter (Signed)
Pt states the medication he was on before for indegestion worked better than omeprazole 40 mg. I didn't see that he was on anything different . Per dr. Linford Arnold swithched pt to pantoprozole 40mg  and sent to pharm

## 2011-08-03 ENCOUNTER — Other Ambulatory Visit: Payer: Self-pay | Admitting: Family Medicine

## 2011-08-05 ENCOUNTER — Other Ambulatory Visit: Payer: Self-pay | Admitting: Family Medicine

## 2011-08-19 IMAGING — CT CT CHEST W/O CM
2 of 3 series · 15 of 36 positions shown, 18 images · non-contrast
Comparison: Chest x-ray of 11/27/2009

CLINICAL DATA: Abnormal chest x-ray with lesion and right upper
lung field, cough, smoking history

CT CHEST WITHOUT CONTRAST
TECHNIQUE: Multidetector CT imaging of the chest was performed
following the standard protocol without IV contrast.

[Series 2: routine chest · axial · 0.70mm/px · z∈[-305,-15]mm · 12 of 68 slices shown, 15 images]
[im 5/68  mediastinal]
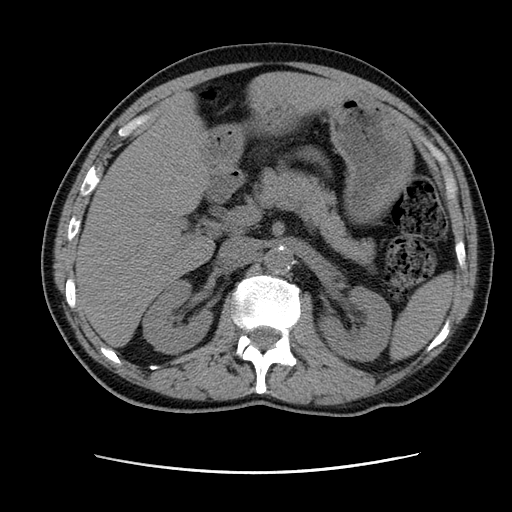
[im 5/68  lung]
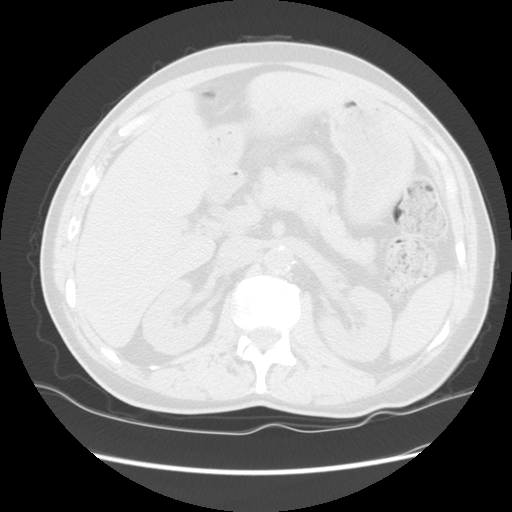
[im 10/68  lung]
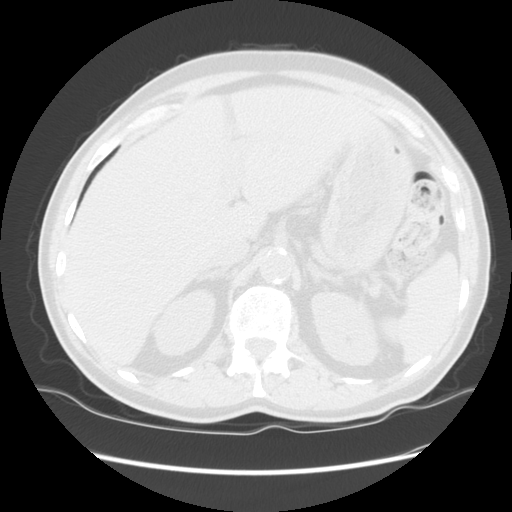
[im 15/68  lung]
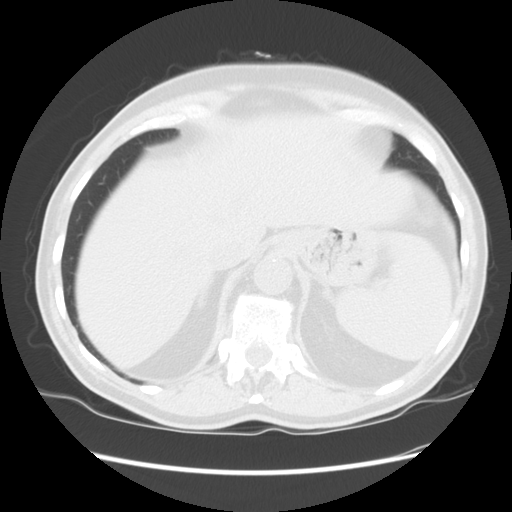
[im 20/68  lung]
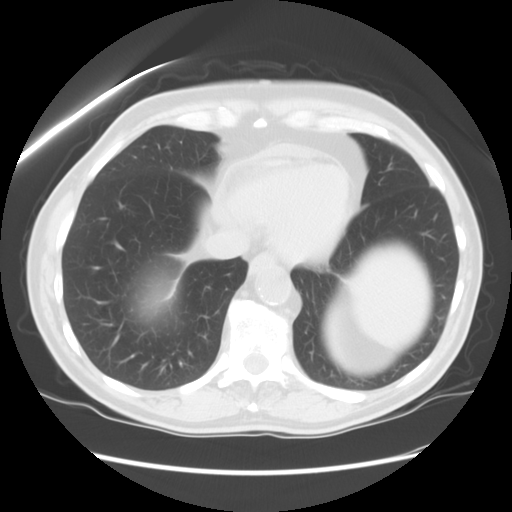
[im 25/68  mediastinal]
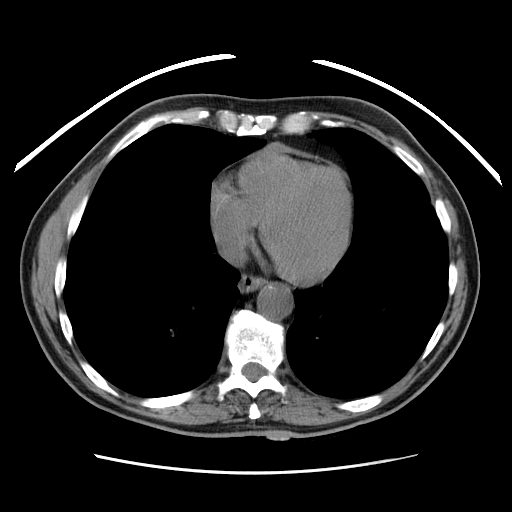
[im 25/68  lung]
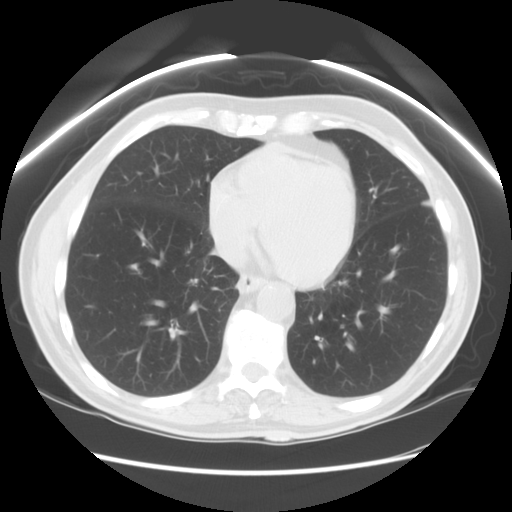
[im 30/68  lung]
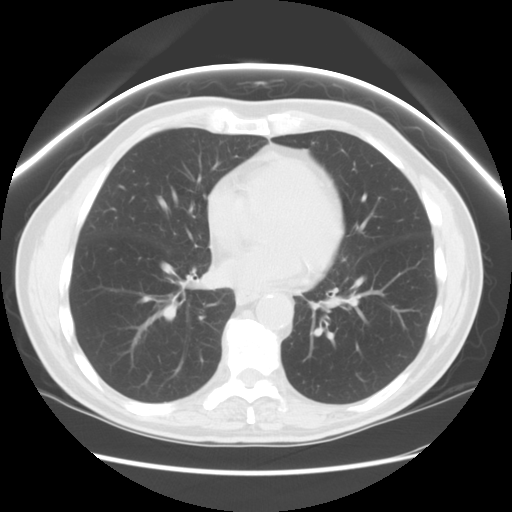
[im 38/68  lung]
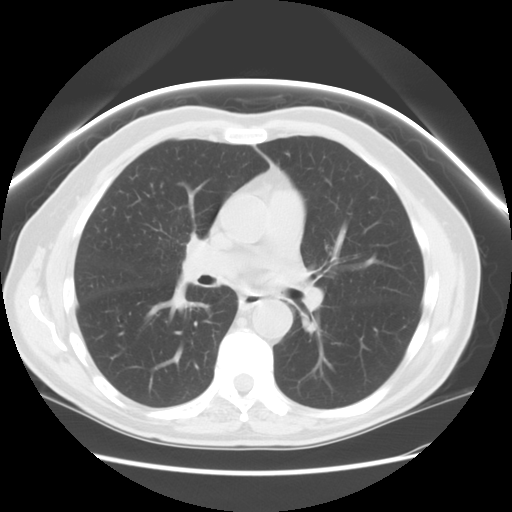
[im 43/68  lung]
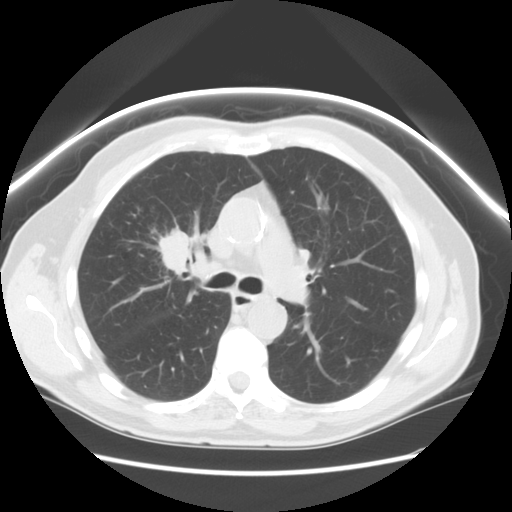
[im 48/68  mediastinal]
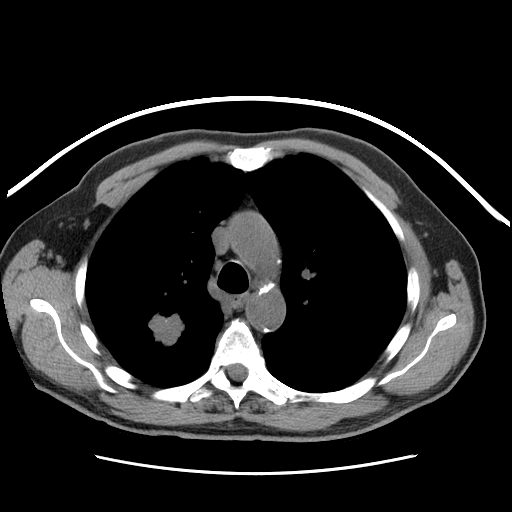
[im 48/68  lung]
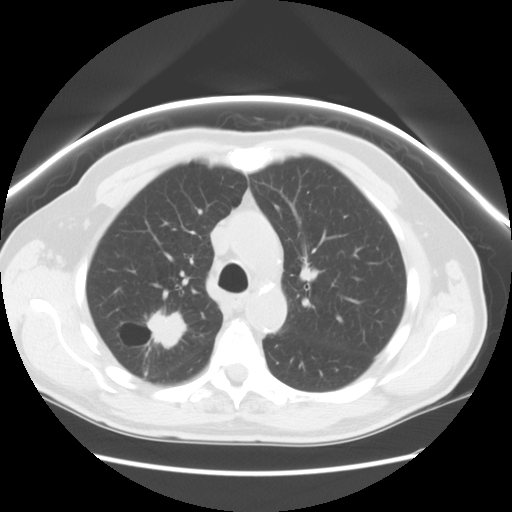
[im 53/68  lung]
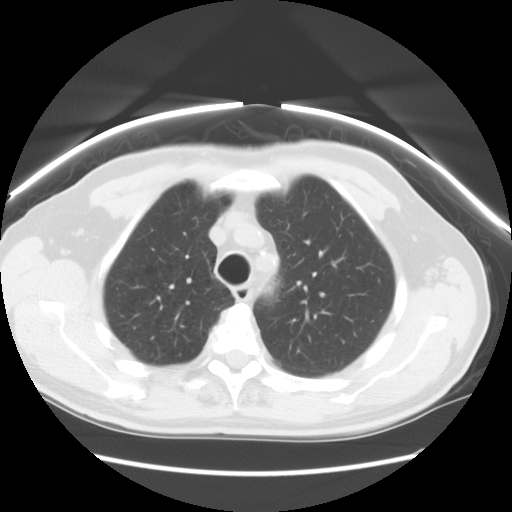
[im 58/68  lung]
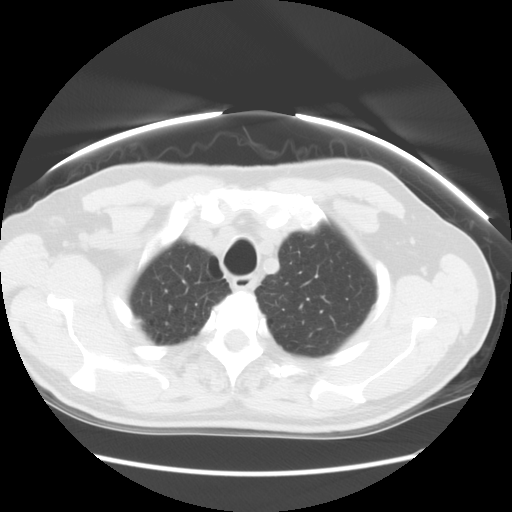
[im 63/68  lung]
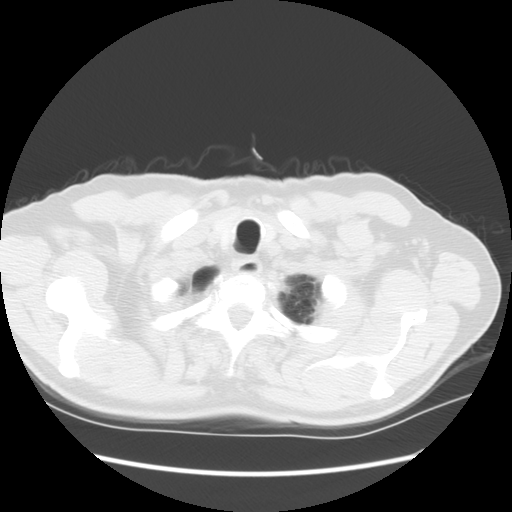

[Series 401: cor · coronal · 0.70mm/px · 3 of 104 slices shown]
[im 21/104  lung]
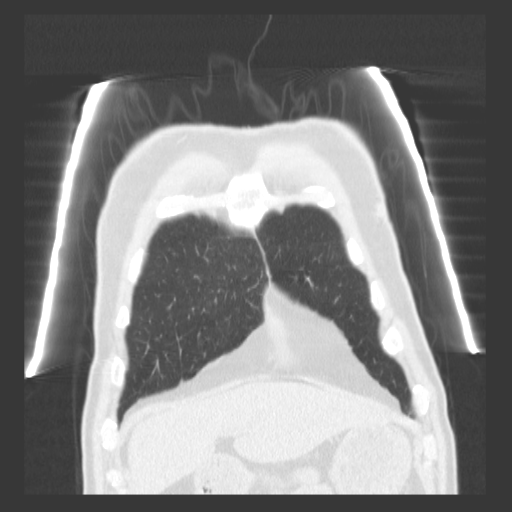
[im 42/104  lung]
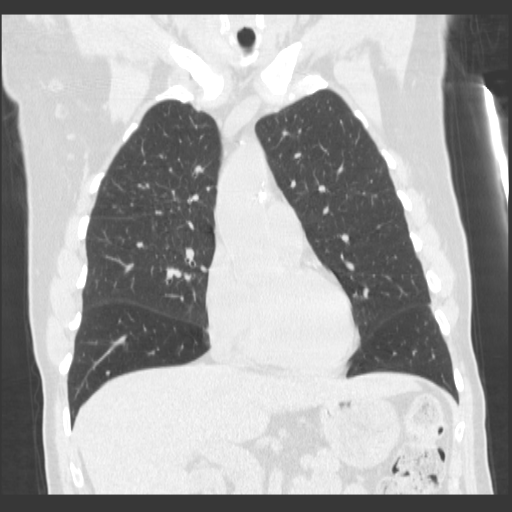
[im 62/104  lung]
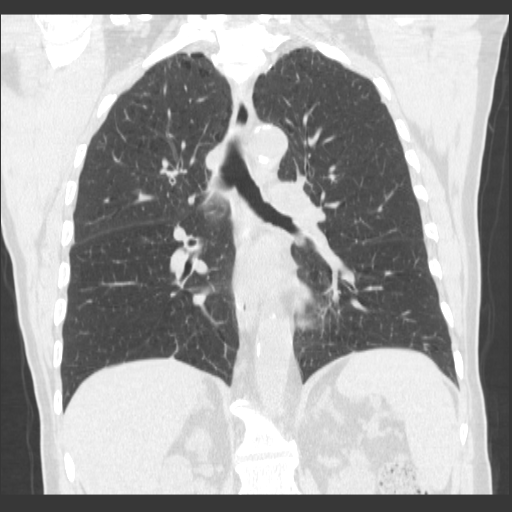

[15 of 36 positions shown; findings below may reference images not displayed]

FINDINGS: There is an irregularly marginated mass within the right
upper lobe posteriorly near the fissure measuring 26 x 27 x 28 mm.
An additional separate irregularly marginated nodular mass is
present within the more inferior mid right upper lobe measuring 27
x 23 x 28 mm.  These areas are both suspicious for primary lung
carcinoma.  A few small nodular opacities are noted within the
surrounding right upper lobe worrisome for satellite metastases.
Mild changes of centrilobular emphysema are present.  No additional
lung lesion is seen.  No pleural effusion is noted.

On soft tissue window images, there are only a few minimally
prominent right hilar nodes.  This study was performed without IV
contrast media.  To assess for metabolic activity of these lesions
and possible metastatic adenopathy, I would recommend PET-CT.
There is some thickening of the anterior pericardium near the
midline of questionable significance.  The portion of the upper
abdomen that is visualized on this unenhanced study is
unremarkable.
IMPRESSION: 1.  Two separate irregularly marginated nodular lesions within the
right upper lobe suspicious for primary lung carcinoma with
possible adjacent satellite metastases.
2.  Slightly prominent right hilar nodes.  Recommend PET-CT to
assess for metabolic activity.
3.  Mild changes of COPD.

## 2011-08-22 ENCOUNTER — Other Ambulatory Visit: Payer: Self-pay | Admitting: *Deleted

## 2011-08-22 MED ORDER — TEMAZEPAM 15 MG PO CAPS: 15.0000 mg | ORAL_CAPSULE | Freq: Every evening | ORAL | Status: DC | PRN

## 2011-08-26 ENCOUNTER — Encounter: Payer: Self-pay | Admitting: *Deleted

## 2011-08-29 ENCOUNTER — Encounter: Payer: Self-pay | Admitting: Family Medicine

## 2011-08-29 ENCOUNTER — Ambulatory Visit (INDEPENDENT_AMBULATORY_CARE_PROVIDER_SITE_OTHER): Payer: Medicare PPO | Admitting: Family Medicine

## 2011-08-29 ENCOUNTER — Other Ambulatory Visit: Payer: Self-pay | Admitting: *Deleted

## 2011-08-29 DIAGNOSIS — I10 Essential (primary) hypertension: Secondary | ICD-10-CM

## 2011-08-29 DIAGNOSIS — E119 Type 2 diabetes mellitus without complications: Secondary | ICD-10-CM

## 2011-08-29 MED ORDER — AMBULATORY NON FORMULARY MEDICATION: Status: AC

## 2011-08-29 MED ORDER — ALBUTEROL SULFATE (2.5 MG/3ML) 0.083% IN NEBU: 2.5000 mg | INHALATION_SOLUTION | Freq: Four times a day (QID) | RESPIRATORY_TRACT | Status: DC | PRN

## 2011-08-29 MED ORDER — ASPIRIN 81 MG PO TBEC: 81.0000 mg | DELAYED_RELEASE_TABLET | Freq: Every day | ORAL | Status: AC

## 2011-08-29 MED ORDER — AMLODIPINE BESYLATE 2.5 MG PO TABS: 2.5000 mg | ORAL_TABLET | Freq: Every day | ORAL | Status: DC

## 2011-08-29 NOTE — Patient Instructions (Signed)
Try to schedule and get your diabetic eye exam. Start taking a baby aspirin daily, 81 mg. Next line. The glipizide. I sent her for a new prescription for amlodipine 2.5 mg daily instead of the 5 mg for currently taking. Followup in 3 months.

## 2011-08-29 NOTE — Progress Notes (Signed)
  Subjective:    Patient ID: Zachary Rios, male    DOB: 22-Apr-1936, 77 y.o.   MRN: [id number]  Diabetes He presents for his follow-up diabetic visit. His disease course has been stable. There are no hypoglycemic associated symptoms. Pertinent negatives for diabetes include no chest pain, no polydipsia, no polyphagia and no polyuria. Symptoms are stable. Current diabetic treatment includes oral agent (monotherapy). He is compliant with treatment all of the time. His weight is stable. He rarely participates in exercise. There is no change in his home blood glucose trend. His breakfast blood glucose range is generally 90-110 mg/dl. An ACE inhibitor/angiotensin II receptor blocker is not being taken. Eye exam is not current.  Hypertension This is a chronic problem. The problem is unchanged. The problem is controlled. Pertinent negatives include no chest pain or shortness of breath. There are no associated agents to hypertension. Past treatments include calcium channel blockers and diuretics.      Review of Systems  Respiratory: Negative for shortness of breath.   Cardiovascular: Negative for chest pain.  Genitourinary: Negative for polyuria.  Hematological: Negative for polydipsia and polyphagia.       Objective:   Physical Exam  Constitutional: He is oriented to person, place, and time. He appears well-developed and well-nourished.  HENT:  Head: Normocephalic and atraumatic.  Cardiovascular: Normal rate, regular rhythm and normal heart sounds.   Pulmonary/Chest: Effort normal and breath sounds normal.  Neurological: He is alert and oriented to person, place, and time.  Skin: Skin is warm and dry.  Psychiatric: He has a normal mood and affect. His behavior is normal.          Assessment & Plan:  DM- Doing very well.  Since his A1c is under 6:00 that she stop his glipizide and see how he does without it. Followup in 3 months. I reminded him again that history for his eye exam and asked  him to try and get this done.  He is also due for urine microalbumin. We will try to collect this at the next visit.  Lab Results  Component Value Date   HGBA1C 5.9 08/29/2011   Hypertension-his blood pressures actually today. I will have him cut his amlodipine and half to 2.5 mg. I sent a new perception to the pharmacy. Followup in 3 months. He denies any dizziness or lightheadedness.  He continues to chew tobacco and is not interested in quitting.

## 2011-08-31 LAB — COMPLETE METABOLIC PANEL WITH GFR
ALT: 9 U/L (ref 0–53)
AST: 13 U/L (ref 0–37)
Albumin: 4.1 g/dL (ref 3.5–5.2)
Alkaline Phosphatase: 58 U/L (ref 39–117)
Calcium: 9.2 mg/dL (ref 8.4–10.5)
Chloride: 103 mEq/L (ref 96–112)
Potassium: 4.9 mEq/L (ref 3.5–5.3)
Sodium: 140 mEq/L (ref 135–145)
Total Protein: 6.9 g/dL (ref 6.0–8.3)

## 2011-08-31 LAB — LIPID PANEL
Total CHOL/HDL Ratio: 4 Ratio
VLDL: 48 mg/dL — ABNORMAL HIGH (ref 0–40)

## 2011-09-01 ENCOUNTER — Other Ambulatory Visit: Payer: Self-pay | Admitting: Family Medicine

## 2011-09-01 ENCOUNTER — Encounter: Payer: Self-pay | Admitting: Family Medicine

## 2011-09-01 MED ORDER — SIMVASTATIN 40 MG PO TABS: 40.0000 mg | ORAL_TABLET | Freq: Every day | ORAL | Status: DC

## 2011-09-06 ENCOUNTER — Encounter: Payer: Self-pay | Admitting: Family Medicine

## 2011-09-22 ENCOUNTER — Other Ambulatory Visit: Payer: Self-pay | Admitting: Family Medicine

## 2011-10-05 ENCOUNTER — Other Ambulatory Visit: Payer: Self-pay | Admitting: Family Medicine

## 2011-10-11 ENCOUNTER — Other Ambulatory Visit: Payer: Self-pay | Admitting: Family Medicine

## 2011-10-17 ENCOUNTER — Other Ambulatory Visit: Payer: Self-pay | Admitting: *Deleted

## 2011-10-17 MED ORDER — ALPRAZOLAM 0.5 MG PO TABS: 0.5000 mg | ORAL_TABLET | Freq: Every evening | ORAL | Status: DC | PRN

## 2011-10-17 MED ORDER — ALBUTEROL SULFATE (2.5 MG/3ML) 0.083% IN NEBU: 2.5000 mg | INHALATION_SOLUTION | Freq: Four times a day (QID) | RESPIRATORY_TRACT | Status: DC | PRN

## 2011-10-24 ENCOUNTER — Other Ambulatory Visit: Payer: Self-pay | Admitting: Family Medicine

## 2011-11-04 ENCOUNTER — Telehealth: Payer: Self-pay | Admitting: *Deleted

## 2011-11-04 NOTE — Telephone Encounter (Signed)
Ok, that is fine

## 2011-11-04 NOTE — Telephone Encounter (Signed)
Called Zachary Rios and apparently I misunderstood the message: he has an apptHERE  May 28 and they want to do a 6 min walk test here.Marland KitchenMarland Kitchen

## 2011-11-04 NOTE — Telephone Encounter (Signed)
OK for order? 

## 2011-11-04 NOTE — Telephone Encounter (Signed)
Annabelle Harman from [facility] called and states they are coming to pt 11/29/2011 and they want to do a 6 min walk test to requal him for oxygen for medicare unnecessary

## 2011-11-08 ENCOUNTER — Encounter: Payer: Self-pay | Admitting: Family Medicine

## 2011-11-08 DIAGNOSIS — I214 Non-ST elevation (NSTEMI) myocardial infarction: Secondary | ICD-10-CM | POA: Insufficient documentation

## 2011-11-11 ENCOUNTER — Ambulatory Visit: Payer: Medicare PPO | Admitting: Physician Assistant

## 2011-11-11 ENCOUNTER — Other Ambulatory Visit: Payer: Self-pay | Admitting: Family Medicine

## 2011-11-11 ENCOUNTER — Other Ambulatory Visit: Payer: Self-pay | Admitting: *Deleted

## 2011-11-11 MED ORDER — PANTOPRAZOLE SODIUM 40 MG PO TBEC: 40.0000 mg | DELAYED_RELEASE_TABLET | Freq: Every day | ORAL | Status: DC

## 2011-11-18 ENCOUNTER — Encounter: Payer: Self-pay | Admitting: *Deleted

## 2011-11-22 ENCOUNTER — Ambulatory Visit (INDEPENDENT_AMBULATORY_CARE_PROVIDER_SITE_OTHER): Payer: Medicare PPO | Admitting: Family Medicine

## 2011-11-22 ENCOUNTER — Encounter: Payer: Self-pay | Admitting: Family Medicine

## 2011-11-22 VITALS — BP 80/38 | HR 81 | Temp 98.5°F | Wt 146.0 lb

## 2011-11-22 DIAGNOSIS — I959 Hypotension, unspecified: Secondary | ICD-10-CM

## 2011-11-22 DIAGNOSIS — I219 Acute myocardial infarction, unspecified: Secondary | ICD-10-CM

## 2011-11-22 DIAGNOSIS — N183 Chronic kidney disease, stage 3 unspecified: Secondary | ICD-10-CM

## 2011-11-22 DIAGNOSIS — I214 Non-ST elevation (NSTEMI) myocardial infarction: Secondary | ICD-10-CM

## 2011-11-22 DIAGNOSIS — J4489 Other specified chronic obstructive pulmonary disease: Secondary | ICD-10-CM

## 2011-11-22 DIAGNOSIS — J449 Chronic obstructive pulmonary disease, unspecified: Secondary | ICD-10-CM

## 2011-11-22 DIAGNOSIS — I428 Other cardiomyopathies: Secondary | ICD-10-CM | POA: Insufficient documentation

## 2011-11-22 DIAGNOSIS — I509 Heart failure, unspecified: Secondary | ICD-10-CM

## 2011-11-22 LAB — BASIC METABOLIC PANEL WITH GFR
BUN: 34 mg/dL — ABNORMAL HIGH (ref 6–23)
CO2: 28 mEq/L (ref 19–32)
Calcium: 8.7 mg/dL (ref 8.4–10.5)
GFR, Est African American: 39 mL/min — ABNORMAL LOW
Glucose, Bld: 97 mg/dL (ref 70–99)

## 2011-11-22 MED ORDER — LISINOPRIL 5 MG PO TABS: 2.5000 mg | ORAL_TABLET | Freq: Every day | ORAL | Status: DC

## 2011-11-22 MED ORDER — CARVEDILOL 6.25 MG PO TABS: 3.1250 mg | ORAL_TABLET | Freq: Two times a day (BID) | ORAL | Status: DC

## 2011-11-22 NOTE — Patient Instructions (Signed)
Cut the lisinopril 5mg  in half (2.5mg ) once a day. Cut the carvedilol 6.25 in half (take half a tab twice a day) Return on Friday to recheck your blood pressure.

## 2011-11-22 NOTE — Progress Notes (Signed)
Subjective:    Patient ID: Zachary Rios, male    DOB: [birth year], 76 y.o.   MRN: [id number]  HPI 76 year old male who was recently admitted to [facility] on May 4 for non-ST elevation MI, heart failure and cardiomyopathy. He was discharged on 11/08/2011 he is supposed to followup with Dr. Dionisio Paschal in 2-3 months, cardiology and Dr. Lula Olszewski, pulmonology.  They started him  On lisinopril and stopped his amlodipine.  He is feeling better and breathing is better. Has appt with cardiologist in 2 weeks.  He has been trying to eat a low-salt diet and monitoring his fluid intake.he denies any chest pain or shortness of breath or lightheadedness or dizziness today. He says his lower extremity edema is well controlled.   Review of Systems BP 80/38  Pulse 81  Temp(Src) 98.5 F (36.9 C) (Oral)  Wt 146 lb (66.225 kg)  SpO2 97%    No Known Allergies  Past Medical History  Diagnosis Date  . Anxiety   . Hypertension   . Diabetes mellitus   . Hyperlipidemia   . Ulcer     peptic  . COPD (chronic obstructive pulmonary disease)   . Anemia   . PAD (peripheral artery disease)   . Cancer 2011    lung    Past Surgical History  Procedure Date  . Thoracotomy 02/2010    RUL resection for lung cancer    History   Social History  . Marital Status: Married    Spouse Name: N/A    Number of Children: N/A  . Years of Education: N/A   Occupational History  . Not on file.   Social History Main Topics  . Smoking status: Current Everyday Smoker -- 1.0 packs/day    Types: Cigarettes  . Smokeless tobacco: Not on file  . Alcohol Use: No  . Drug Use: No  . Sexually Active: Not on file   Other Topics Concern  . Not on file   Social History Narrative  . No narrative on file    Family History  Problem Relation Age of Onset  . Cancer Father   . Cancer Brother     lung    Outpatient Encounter Prescriptions as of 11/22/2011  Medication Sig Dispense Refill  . albuterol (PROAIR HFA)  108 (90 BASE) MCG/ACT inhaler Inhale 2 puffs into the lungs every 6 (six) hours as needed.        Marland Kitchen albuterol (PROVENTIL) (2.5 MG/3ML) 0.083% nebulizer solution Take 3 mLs (2.5 mg total) by nebulization every 6 (six) hours as needed for wheezing.  90 vial  3  . ALPRAZolam (XANAX) 0.5 MG tablet Take 1 tablet (0.5 mg total) by mouth at bedtime as needed for sleep.  90 tablet  0  . AMBULATORY NON FORMULARY MEDICATION Medication Name: Glucometer adn strips and lancet to test once a day.  Dx 250.00  1 Units  11  . amitriptyline (ELAVIL) 75 MG tablet TAKE 1 TABLET AT BEDTIME.  30 tablet  1  . aspirin 81 MG EC tablet Take 1 tablet (81 mg total) by mouth daily. Swallow whole.  30 tablet  12  . carvedilol (COREG) 6.25 MG tablet Take 0.5 tablets (3.125 mg total) by mouth 2 (two) times daily with a meal.  1 tablet  0  . docusate sodium (COLACE) 100 MG capsule Take 100 mg by mouth 2 (two) times daily.        . furosemide (LASIX) 20 MG tablet Take 1 tablet (20 mg total)  by mouth daily.  5 tablet  0  . glipiZIDE (GLUCOTROL XL) 5 MG 24 hr tablet Take 5 mg by mouth daily.      Marland Kitchen ipratropium-albuterol (DUONEB) 0.5-2.5 (3) MG/3ML SOLN Take 3 mLs by nebulization every 6 (six) hours as needed.  360 mL  11  . iron polysaccharides (POLY-IRON 150) 150 MG capsule Take 150 mg by mouth 2 (two) times daily.        Marland Kitchen lisinopril (PRINIVIL,ZESTRIL) 5 MG tablet Take 0.5 tablets (2.5 mg total) by mouth daily.  1 tablet  0  . omeprazole (PRILOSEC) 40 MG capsule Take 1 capsule (40 mg total) by mouth daily.  30 capsule  6  . pantoprazole (PROTONIX) 40 MG tablet Take 1 tablet (40 mg total) by mouth daily.  30 tablet  3  . polyethylene glycol (MIRALAX / GLYCOLAX) packet Take 17 g by mouth daily.        . potassium chloride (KLOR-CON) 20 MEQ packet Take 20 mEq by mouth 2 (two) times daily.      . RESTORIL 15 MG capsule TAKE 1 CAPSULE AT BEDTIME AS NEEDED FOR INSOMNIA  30 each  0  . simvastatin (ZOCOR) 40 MG tablet Take 1 tablet (40 mg  total) by mouth at bedtime.  30 tablet  3  . terazosin (HYTRIN) 5 MG capsule TAKE ONE CAPSULE TWO TIMES A DAY  60 capsule  1  . DISCONTD: carvedilol (COREG) 6.25 MG tablet Take 6.25 mg by mouth 2 (two) times daily with a meal.      . DISCONTD: lisinopril (PRINIVIL,ZESTRIL) 5 MG tablet Take 5 mg by mouth daily.      Marland Kitchen DISCONTD: amLODipine (NORVASC) 2.5 MG tablet Take 1 tablet (2.5 mg total) by mouth daily.  30 tablet  11  . DISCONTD: CARDURA 4 MG tablet TAKE 1 TABLET TWICE A DAY  60 each  2  . DISCONTD: HYDROcodone-acetaminophen (NORCO) 5-325 MG per tablet Take 2 tablets by mouth every 8 (eight) hours as needed.  90 tablet  0          Objective:   Physical Exam  Constitutional: He is oriented to person, place, and time. He appears well-developed and well-nourished.  HENT:  Head: Normocephalic and atraumatic.  Cardiovascular: Normal rate, regular rhythm and normal heart sounds.   Pulmonary/Chest: Effort normal and breath sounds normal.  Neurological: He is alert and oriented to person, place, and time.  Skin: Skin is warm and dry.  Psychiatric: He has a normal mood and affect. His behavior is normal.          Assessment & Plan:  CHF he is currently on carvedilol as well as lisinopril and Lasix 20 mg. His discharge papers at 40 mg but the bottle he brought in today for 40. He's been taking his medications regularly since she's been home. He is due for BMP today. Strongly encouraged him to keep his followup with cardiology.  Hypotension - he denies any dizziness or lightheadedness today but he is clearly hypotensive. I had him cut his lisinopril and half as well as the carvedilol. He needs the beta blocker because he had a non-ST elevation MI and he needs the ACE inhibitor because of the congestive heart failure. He started on Lasix 20 mg we could continue this. He is due for BMP today. His terazosin may also be contributing to his low blood pressure. Consider adjusting his dose that it  may affect his BPH symptoms. He will followup in 3 days  to recheck his blood pressure here in the office.  COPD-he has a followup visit with his pulmonologist in. He did give him samples of Symbicort today. Hair he has albuterol and DuoNeb at home.

## 2011-11-25 ENCOUNTER — Ambulatory Visit (INDEPENDENT_AMBULATORY_CARE_PROVIDER_SITE_OTHER): Payer: Medicare PPO | Admitting: Family Medicine

## 2011-11-25 ENCOUNTER — Other Ambulatory Visit: Payer: Self-pay | Admitting: Family Medicine

## 2011-11-25 ENCOUNTER — Telehealth: Payer: Self-pay | Admitting: *Deleted

## 2011-11-25 DIAGNOSIS — I959 Hypotension, unspecified: Secondary | ICD-10-CM

## 2011-11-25 NOTE — Progress Notes (Addendum)
  Subjective:    Patient ID: Zachary Rios, male    DOB: [birth year], 76 y.o.   MRN: [id number] BP check. Bp was low at office visit on Monday HPI    Review of Systems     Objective:   Physical Exam        Assessment & Plan:  Hypotension- BP a little better than 2 days ago. continue to hold lasix and come back for BP check on Monday or Tuesday.  Will check BMP next week as well.  Cipriano Bunker, MD

## 2011-11-25 NOTE — Telephone Encounter (Signed)
Pt notiied of instructions. KJ LPN

## 2011-11-29 ENCOUNTER — Encounter: Payer: Self-pay | Admitting: Family Medicine

## 2011-11-29 ENCOUNTER — Ambulatory Visit: Payer: Medicare PPO | Admitting: Family Medicine

## 2011-11-29 ENCOUNTER — Ambulatory Visit (INDEPENDENT_AMBULATORY_CARE_PROVIDER_SITE_OTHER): Payer: Medicare PPO | Admitting: Family Medicine

## 2011-11-29 DIAGNOSIS — I959 Hypotension, unspecified: Secondary | ICD-10-CM

## 2011-11-29 NOTE — Progress Notes (Signed)
Patient ID: Zachary Rios, male   DOB: May 25, 1936, 76 y.o.   MRN: [id number] BP follow up.

## 2011-11-29 NOTE — Progress Notes (Signed)
  Subjective:    Patient ID: Zachary Rios, male    DOB: 1935/11/05, 76 y.o.   MRN: [id number]  HPI Here to followup for hypertension. We have held his Lasix over the holiday weekend and asked to come back in for repeat blood pressure check. He says he is feeling much better now.   Review of Systems     Objective:   Physical Exam        Assessment & Plan:  Hypotension.- Continue half a tab of lisinopril. Continue half a tab of carvedilol. Continue to hold Lasix and with me in one week. Cipriano Bunker, MD

## 2011-12-06 ENCOUNTER — Encounter: Payer: Self-pay | Admitting: Family Medicine

## 2011-12-06 ENCOUNTER — Telehealth: Payer: Self-pay | Admitting: Family Medicine

## 2011-12-06 ENCOUNTER — Ambulatory Visit (INDEPENDENT_AMBULATORY_CARE_PROVIDER_SITE_OTHER): Payer: Medicare PPO | Admitting: Family Medicine

## 2011-12-06 VITALS — BP 82/47 | HR 67 | Ht 68.0 in | Wt 142.0 lb

## 2011-12-06 DIAGNOSIS — I509 Heart failure, unspecified: Secondary | ICD-10-CM

## 2011-12-06 DIAGNOSIS — I10 Essential (primary) hypertension: Secondary | ICD-10-CM

## 2011-12-06 NOTE — Telephone Encounter (Signed)
Called pt to notify him and he states if i could write down instructions and leave it at the front  Desk and he will come by and pick them up. Printed phone note and left up front for patient

## 2011-12-06 NOTE — Progress Notes (Signed)
  Subjective:    Patient ID: Zachary Rios, male    DOB: Jul 23, 1935, 76 y.o.   MRN: [id number]  HPI F/U  Low BP. Says done well. No dizziness, lighheadness or SOB or CP.  He says he actually feels fine today. He brought in the changes of the cardiologist may. They did restart his Lasix. We held it for about a week because of low blood pressures. We were unable to get a systolic over 100. Evidently when he went to see the cardiologist his blood pressure was 114 on the top. He is currently taking half a tab of his lisinopril as well as a half a tab of 25 mg metoprolol. He is also on digoxin for rate control. He denies any lower extremity swelling. He denies any increase shortness of breath. He does have COPD. He feels he is at his baseline.   Review of Systems     Objective:   Physical Exam  Constitutional: He is oriented to person, place, and time. He appears well-developed and well-nourished.  HENT:  Head: Normocephalic and atraumatic.  Cardiovascular: Normal rate, regular rhythm and normal heart sounds.   Pulmonary/Chest: Effort normal and breath sounds normal.  Musculoskeletal: He exhibits no edema.  Neurological: He is alert and oriented to person, place, and time.  Skin: Skin is warm and dry.  Psychiatric: He has a normal mood and affect. His behavior is normal.          Assessment & Plan:  Hypotension-  Put a call into his cardiologist and his blood pressure still low today to see if they have any recommendations. We had originally held his Lasix from last week because he had multiple repeat blood pressure check for his systolic was under 100. Called and spoke with the PA at the cardiology office, Milta Deiters.  After further discussion we decided to stop the Hytrin for now. We will monitor his BPH symptoms. Also decrease Lasix to every other day. Repeat blood pressure in one week. I will have him bring in his blood pressure pills at his followup so we can confirm that he is taking  them appropriately.  CHF - it is important for him to be on an ACE inhibitor, beta blocker and diuretic. Unfortunately I think this combination even though he is already on the lowest doses as long as blood pressure too much. He is currently asymptomatic but I worry that this could increase his risk of falls.

## 2011-12-06 NOTE — Telephone Encounter (Signed)
Call pt: Have him stop the Hytrin which he takes at bedtime for his prostate. We will monitor to see if he gets any recurrence of his prostate symptoms. Also change Lasix to every other day. I spoke with Cyndia Diver at his cardiology office and this is what we decided together.  Schedule followup appointment for blood pressure with me in one week. At that time we will check a BMP. Also asked him to bring in all of his medications so that we can verify that he is taking them as we have instructed.

## 2011-12-08 ENCOUNTER — Other Ambulatory Visit: Payer: Self-pay | Admitting: Family Medicine

## 2011-12-10 ENCOUNTER — Other Ambulatory Visit: Payer: Self-pay | Admitting: Family Medicine

## 2011-12-13 ENCOUNTER — Ambulatory Visit (INDEPENDENT_AMBULATORY_CARE_PROVIDER_SITE_OTHER): Payer: Medicare PPO | Admitting: Family Medicine

## 2011-12-13 ENCOUNTER — Encounter: Payer: Self-pay | Admitting: Family Medicine

## 2011-12-13 VITALS — BP 76/38 | HR 95 | Ht 68.0 in | Wt 142.0 lb

## 2011-12-13 DIAGNOSIS — I959 Hypotension, unspecified: Secondary | ICD-10-CM

## 2011-12-13 DIAGNOSIS — I509 Heart failure, unspecified: Secondary | ICD-10-CM

## 2011-12-13 NOTE — Progress Notes (Signed)
  Subjective:    Patient ID: Zachary Rios, male    DOB: [birth year], 76 y.o.   MRN: [id number]  HPI F/u hypotension. Says if walks a lot feels lightheaded but at rest feels fine.  No CP or SOB. Brought in todays pills but not his pill box. Says it walks about 20 step will feel dizzy. He has not taken his blood pressure pills for today including his Lasix. We did call him and instructed him to take the Lasix every other day. He says his daughter organizes his pills but is pretty sure he still taking the Lasix daily. He says he has to pee about every 20 minutes after he takes the pill.   Review of Systems     Objective:   Physical Exam  Constitutional: He is oriented to person, place, and time. He appears well-developed and well-nourished.  HENT:  Head: Normocephalic and atraumatic.  Cardiovascular: Normal rate, regular rhythm and normal heart sounds.   Pulmonary/Chest: Effort normal and breath sounds normal.  Neurological: He is alert and oriented to person, place, and time.  Skin: Skin is warm and dry.  Psychiatric: He has a normal mood and affect. His behavior is normal.          Assessment & Plan:  Hypotension-at this point in time I would like him to decrease his Lasix to 2 days per week. I want him to hold all of his blood pressure pills for the next 2 days. Today I think he is way too low and he has not even taken his medications yet. Asked him to take a handout to his cardiologist and he is following up with on Friday. We will check a BMP to make sure electrolytes are stable. I also wrote down once again the he is to only take half of a tab of the lisinopril as well as a half of a tab of the metoprolol which he is on for congestive heart failure.Asked him to hold his potassium when his is not taking the lasix.    CHF - on ACE and betablocker but we may need to discointinue one of them as BP is too low. Will start with lowering the lasix.  Continue digoxin.

## 2011-12-13 NOTE — Patient Instructions (Addendum)
Hold your lasix, lisinopril, and metoprol until Thursday.  Then take Lasix twice a week (on Thursday and Monday).   Make sure only taking half a tab of the lisinopril and half a tab of the metoprolol.   See the cardiologist on Friday.   Let me know if you make any changes

## 2011-12-14 LAB — BASIC METABOLIC PANEL
BUN: 44 mg/dL — ABNORMAL HIGH (ref 6–23)
Chloride: 100 mEq/L (ref 96–112)
Creat: 2.03 mg/dL — ABNORMAL HIGH (ref 0.50–1.35)
Glucose, Bld: 153 mg/dL — ABNORMAL HIGH (ref 70–99)

## 2011-12-22 ENCOUNTER — Ambulatory Visit (INDEPENDENT_AMBULATORY_CARE_PROVIDER_SITE_OTHER): Payer: Medicare PPO | Admitting: Family Medicine

## 2011-12-22 VITALS — BP 93/45 | HR 79 | Temp 98.3°F

## 2011-12-22 DIAGNOSIS — R350 Frequency of micturition: Secondary | ICD-10-CM

## 2011-12-22 DIAGNOSIS — R300 Dysuria: Secondary | ICD-10-CM

## 2011-12-22 LAB — POCT URINALYSIS DIPSTICK
Blood, UA: NEGATIVE
Nitrite, UA: NEGATIVE
Protein, UA: NEGATIVE
pH, UA: 6

## 2011-12-22 MED ORDER — CIPROFLOXACIN HCL 500 MG PO TABS: 500.0000 mg | ORAL_TABLET | Freq: Two times a day (BID) | ORAL | Status: AC

## 2011-12-22 NOTE — Progress Notes (Signed)
  Subjective:    Patient ID: Zachary Rios, male    DOB: 1935-10-14, 76 y.o.   MRN: [id number]  HPI Please see prior from it. He complains of urinary frequency and dysuria. He has not had any recent urinary tract infections.  In the hospital a month ago. No catheterization.  No fever.    Review of Systems     Objective:   Physical Exam        Assessment & Plan:  UTI - will send for cultlure.  Will tx with cipro 50mg  bid for 7 days.

## 2011-12-23 ENCOUNTER — Ambulatory Visit: Payer: Medicare PPO

## 2011-12-27 ENCOUNTER — Other Ambulatory Visit: Payer: Self-pay | Admitting: Physician Assistant

## 2011-12-27 DIAGNOSIS — R300 Dysuria: Secondary | ICD-10-CM

## 2012-01-03 ENCOUNTER — Encounter: Payer: Self-pay | Admitting: Family Medicine

## 2012-01-03 ENCOUNTER — Ambulatory Visit (INDEPENDENT_AMBULATORY_CARE_PROVIDER_SITE_OTHER): Payer: Medicare PPO | Admitting: Family Medicine

## 2012-01-03 VITALS — BP 94/50 | HR 88 | Ht 68.0 in | Wt 145.0 lb

## 2012-01-03 DIAGNOSIS — I959 Hypotension, unspecified: Secondary | ICD-10-CM

## 2012-01-03 DIAGNOSIS — J449 Chronic obstructive pulmonary disease, unspecified: Secondary | ICD-10-CM

## 2012-01-03 DIAGNOSIS — I509 Heart failure, unspecified: Secondary | ICD-10-CM

## 2012-01-03 DIAGNOSIS — F329 Major depressive disorder, single episode, unspecified: Secondary | ICD-10-CM

## 2012-01-03 DIAGNOSIS — Z9181 History of falling: Secondary | ICD-10-CM

## 2012-01-03 NOTE — Patient Instructions (Addendum)
Call me if you start having problems with your breathing. Home Safety and Preventing Falls Falls are a leading cause of injury and while they affect all age groups, falls have greater short-term and long-term impact on older age groups. However, falls should not be a part of life or aging. It is possible for individuals and their families to use preventive measures to significantly decrease the likelihood that anyone, especially an older adult, will fall. There are many simple measures which can make your home safer with respect to preventing falls. The following actions can help reduce falls among all members of your family and are especially important as you age, when your balance, lower limb strength, coordination, and eyesight may be declining. The use of preventive measures will help to reduce you and your family's risk of falls and serious medical consequences. OUTDOORS  Repair cracks and edges of walkways and driveways.   Remove high doorway thresholds and trim shrubbery on the main path into your home.   Ensure there is good outside lighting at main entrances and along main walkways.   Clear walkways of tools, rocks, debris, and clutter.   Check that handrails are not broken and are securely fastened. Both sides of steps should have handrails.   In the garage, be attentive to and clean up grease or oil spills on the cement. This can make the surface extremely slippery.   In winter, have leaves, snow, and ice cleared regularly.   Use sand or salt on walkways during winter months.  BATHROOM  Install grab bars by the toilet and in the tub and shower.   Use non-skid mats or decals in the tub or shower.   If unable to easily stand unsupported while showering, place a plastic non slip stool in the shower to sit on when needed.   Install night lights.   Keep floors dry and clean up all water on the floor immediately.   Remove soap buildup in tub or shower on a regular basis.    Secure bath mats with non-slip, double-sided rug tape.   Remove tripping hazards from the floors.  BEDROOMS  Install night lights.   Do not use oversized bedding.   Make sure a bedside light is easy to reach.   Keep a telephone by your bedside.   Make sure that you can get in and out of your bed easily.   Have a firm chair, with side arms, to use for getting dressed.   Remove clutter from around closets.   Store clothing, bed coverings, and other household items where you can reach them comfortably.   Remove tripping hazards from the floor.  LIVING AREAS AND STAIRWAYS  Turn on lights to avoid having to walk through dark areas.   Keep lighting uniform in each room. Place brighter lightbulbs in darker areas, including stairways.   Replace lightbulbs that burn out in stairways immediately.   Arrange furniture to provide for clear pathways.   Keep furniture in the same place.   Eliminate or tape down electrical cables in high traffic areas.   Place handrails on both sides of stairways. Use handrails when going up or down stairs.   Most falls occur on the top or bottom 3 steps.   Fix any loose handrails. Make sure handrails on both sides of the stairways are as long as the stairs.   Remove all walkway obstacles.   Coil or tape electrical cords off to the side of walking areas and out of  the way. If using many extension cords, have an electrician put in a new wall outlet to reduce or eliminate them.   Make sure spills are cleaned up quickly and allow time for drying before walking on freshly cleaned floors.   Firmly attach carpet with non-skid or two-sided tape.   Keep frequently used items within easy reach.   Remove tripping hazards such as throw rugs and clutter in walkways. Never leave objects on stairs.   Get rid of throw rugs elsewhere if possible.   Eliminate uneven floor surfaces.   Make sure couches and chairs are easy to get into and out of.   Check  carpeting to make sure it is firmly attached along stairs.   Make repairs to worn or loose carpet promptly.   Select a carpet pattern that does not visually hide the edge of steps.   Avoid placing throw rugs or scatter rugs at the top or bottom of stairways, or properly secure with carpet tape to prevent slippage.   Have an electrician put in a light switch at the top and bottom of the stairs.   Get light switches that glow.   Avoid the following practices: hurrying, inattention, obscured vision, carrying large loads, and wearing slip-on shoes.   Be aware of all pets.  KITCHEN  Place items that are used frequently, such as dishes and food, within easy reach.   Keep handles on pots and pans toward the center of the stove. Use back burners when possible.   Make sure spills are cleaned up quickly and allow time for drying.   Avoid walking on wet floors.   Avoid hot utensils and knives.   Position shelves so they are not too high or low.   Place commonly used objects within easy reach.   If necessary, use a sturdy step stool with a grab bar when reaching.   Make sure electrical cables are out of the way.   Do not use floor polish or wax that makes floors slippery.  OTHER HOME FALL PREVENTION STRATEGIES  Wear low heel or rubber sole shoes that are supportive and fit well.   Wear closed toe shoes.   Know and watch for side effects of medications. Have your caregiver or pharmacist look at all your medicines, even over-the-counter medicines. Some medicines can make you sleepy or dizzy.   Exercise regularly. Exercise makes you stronger and improves your balance and coordination.   Limit use of alcohol.   Use eyeglasses if necessary and keep them clean. Have your vision checked every year.   Organize your household in a manner that minimizes the need to walk distances when hurried, or go up and down stairs unnecessarily. For example, have a phone placed on at least each floor  of your home. If possible, have a phone beside each sitting or lying area where you spend the most time at home. Keep emergency numbers posted at all phones.   Use non-skid floor wax.   When using a ladder, make sure:   The base is firm.   All ladder feet are on level ground.   The ladder is angled against the wall properly.   When climbing a ladder, face the ladder and hold the ladder rungs firmly.   If reaching, always keep your hips and body weight centered between the rails.   When using a stepladder, make sure it is fully opened and both spreaders are firmly locked.   Do not climb a closed stepladder.  Avoid climbing beyond the second step from the top of a stepladder and the 4th rung from the top of an extension ladder.   Learn and use mobility aids as needed.   Change positions slowly. Arise slowly from sitting and lying positions. Sit on the edge of your bed before getting to your feet.   If you have a history of falls, ask someone to add color or contrast paint or tape to grab bars and handrails in your home.   If you have a history of falls, ask someone to place contrasting color strips on first and last steps.   Install an electrical emergency response system if you need one, and know how to use it.   If you have a medical or other condition that causes you to have limited physical strength, it is important that you reach out to family and friends for occasional help.  FOR CHILDREN:  If young children are in the home, use safety gates. At the top of stairs use screw-mounted gates; use pressure-mounted gates for the bottom of the stairs and doorways between rooms.   Young children should be taught to descend stairs on their stomachs, feet first, and later using the handrail.   Keep drawers fully closed to prevent them from being climbed on or pulled out entirely.   Move chairs, cribs, beds and other furniture away from windows.   Consider installing window guards  on windows ground floor and up, unless they are emergency fire exits. Make sure they have easy release mechanisms.   Consider installing special locks that only allow the window to be opened to a certain height.   Never rely on window screens to prevent falls.   Never leave babies alone on changing tables, beds or sofas. Use a changing table that has a restraining strap.   When a child can pull to a standing position, the crib mattress should be adjusted to its lowest position. There should be at least 26 inches between the top rails of the crib drop side and the mattress. Toys, bumper pads, and other objects that can be used as steps to climb out should be removed from the crib.   On bunk beds never allow a child under age 36 to sleep on the top bunk. For older children, if the upper bunk is not against a wall, use guard rails on both sides. No matter how old a child is, keep the guard rails in place on the top bunk since children roll during sleep. Do not permit horseplay on bunks.   Grass and soil surfaces beneath backyard playground equipment should be replaced with hardwood chips, shredded wood mulch, sand, pea gravel, rubber, crushed stone, or another safer material at depths of at least 9 to 12 inches.   When riding bikes or using skates, skateboards, skis, or snowboards, require children to wear helmets. Look for those that have stickers stating that they meet or exceed safety standards.   Vertical posts or pickets in deck, balcony, and stairway railings should be no more than 3 1/2 inches apart if a young baby will have access to the area. The space between horizontal rails or bars, and between the floor and the first horizontal rail or bar, should be no more than 3 1/2 inches.  Document Released: 06/10/2002 Document Revised: 06/09/2011 Document Reviewed: 04/09/2009 [facility] Patient Information 2012 [place], [place].

## 2012-01-03 NOTE — Progress Notes (Addendum)
  Subjective:    Patient ID: Zachary Rios, male    DOB: 10/31/1935, 76 y.o.   MRN: [id number]  HPI Hypotension/CHF - See cards in about 6 weeks. He says overall he feels well on his current medication regimen. He says he does occasionally feel a little lightheaded if he bends over and then stands up too quickly but otherwise denies any headaches, dizziness or lightheadedness. He denies feeling fatigued or sluggish. He says he does occasionally feel short of breath with activity but feels this is more from his COPD. He has decreased his Lasix to twice a week and says this is still well to control his swelling. I encouraged him to decrease it because I felt it was causing dehydration.  COPD - Using alb TID.  He feels is breathing is the same. Hasn't gotten worse.     Review of Systems     Objective:   Physical Exam  Constitutional: He is oriented to person, place, and time. He appears well-developed and well-nourished.  HENT:  Head: Normocephalic and atraumatic.  Cardiovascular: Normal rate, regular rhythm and normal heart sounds.   Pulmonary/Chest: Effort normal and breath sounds normal.       Diffuse rhonchi  Musculoskeletal: He exhibits no edema.  Neurological: He is alert and oriented to person, place, and time.  Skin: Skin is warm and dry.  Psychiatric: He has a normal mood and affect. His behavior is normal.          Assessment & Plan:  Hypotension - BP is still low.  He overall is tolerating the BP well though I would like to see it over 100 and age is a concern. He is already on half tab of the lowest dose on ACE and BB.  Has f/u with cards in the next month.    CHF - Check dig level. On betablocker and ACEi.  Edema is well controlled  COPD - Seeing pulmonology.  Had appt 2 weeks ago.  No  changes to his meds. Using his albuterol TID.   Depression- PHQ-9 score of 6. Consider adjust dose of amitryptiline.    Fall Assessment- Score of 7.  Moderate risk for falls.  Will mail  H.O. No how to reduce risk for falls in the home

## 2012-01-03 NOTE — Addendum Note (Signed)
Addended by: Nani Gasser D on: 01/03/2012 09:25 PM   Modules accepted: Level of Service

## 2012-01-10 ENCOUNTER — Other Ambulatory Visit: Payer: Self-pay | Admitting: Family Medicine

## 2012-01-19 ENCOUNTER — Other Ambulatory Visit: Payer: Self-pay | Admitting: Family Medicine

## 2012-01-20 ENCOUNTER — Encounter: Payer: Self-pay | Admitting: Family Medicine

## 2012-01-20 DIAGNOSIS — I447 Left bundle-branch block, unspecified: Secondary | ICD-10-CM | POA: Insufficient documentation

## 2012-01-30 ENCOUNTER — Other Ambulatory Visit: Payer: Self-pay | Admitting: Family Medicine

## 2012-02-15 ENCOUNTER — Other Ambulatory Visit: Payer: Self-pay | Admitting: Family Medicine

## 2012-02-24 ENCOUNTER — Other Ambulatory Visit: Payer: Self-pay | Admitting: Family Medicine

## 2012-03-15 HISTORY — PX: TRANSURETHRAL RESECTION OF PROSTATE: SHX73

## 2012-04-02 ENCOUNTER — Encounter: Payer: Self-pay | Admitting: Family Medicine

## 2012-04-02 ENCOUNTER — Ambulatory Visit (INDEPENDENT_AMBULATORY_CARE_PROVIDER_SITE_OTHER): Payer: Medicare PPO | Admitting: Family Medicine

## 2012-04-02 VITALS — BP 88/45 | HR 115 | Temp 98.5°F | Wt 143.0 lb

## 2012-04-02 DIAGNOSIS — J441 Chronic obstructive pulmonary disease with (acute) exacerbation: Secondary | ICD-10-CM

## 2012-04-02 MED ORDER — PREDNISONE 20 MG PO TABS: ORAL_TABLET | ORAL | Status: AC

## 2012-04-02 MED ORDER — DOXYCYCLINE HYCLATE 100 MG PO TABS: ORAL_TABLET | ORAL | Status: DC

## 2012-04-02 MED ORDER — PREDNISONE 20 MG PO TABS: ORAL_TABLET | ORAL | Status: DC

## 2012-04-02 MED ORDER — DOXYCYCLINE HYCLATE 100 MG PO TABS: ORAL_TABLET | ORAL | Status: AC

## 2012-04-02 NOTE — Progress Notes (Signed)
CC: Zachary Rios is a 76 y.o. male is here for Cough and Nasal Congestion   Subjective: HPI:  Patient reports for head pressure in addition to clear nasal drainage in addition to a productive cough has been getting worse since Thursday. He's feeling somewhat short of breath when he is up walking around at rest feels good and denies orthopnea, he wears 2 L oxygen to bed and still feels that he's getting enough oxygen when sleeping. He's been using albuterol every 4 hours and this provides relief of his cough and shortness of breath for about 3 hours. He's been taking an over-the-counter medication but name escapes him at this moment. He's a former smoker. He denies chest pain with exertion, fevers, chills, confusion, nausea, vomiting, abdominal pain, GI disturbance. He states that his cough produces clear phlegm without any signs of blood or resty color.    Review Of Systems Outlined In HPI  Past Medical History  Diagnosis Date  . Anxiety   . Hypertension   . Diabetes mellitus   . Hyperlipidemia   . Ulcer     peptic  . COPD (chronic obstructive pulmonary disease)   . Anemia   . PAD (peripheral artery disease)   . Cancer 2011    lung     Family History  Problem Relation Age of Onset  . Cancer Father   . Cancer Brother     lung     History  Substance Use Topics  . Smoking status: Current Every Day Smoker -- 1.0 packs/day    Types: Cigarettes  . Smokeless tobacco: Not on file  . Alcohol Use: No     Objective: Filed Vitals:   04/02/12 1125  BP: 88/45  Pulse: 115  Temp: 98.5 F (36.9 C)    General: Alert and Oriented, No Acute Distress, no confusion HEENT: Pupils equal, round, reactive to light. Conjunctivae clear.  External ears unremarkable, canals clear with intact TMs with appropriate landmarks.  Middle ear appears open without effusion. Pink inferior turbinates.  Moist mucous membranes, pharynx without inflammation nor lesions.  Neck supple without palpable  lymphadenopathy nor abnormal masses. Lungs: Moderate rhonchi diffusely in all lung fields, no wheezing, no rales Comfortable work of breathing. Good air movement. Cardiac: Regular rate and rhythm. Normal S1/S2.  No murmurs, rubs, nor gallops.   Extremities: No peripheral edema.  Strong peripheral pulses.  Skin: Warm and dry.  Assessment & Plan: [name] was seen today for cough and nasal congestion.  Diagnoses and associated orders for this visit:  Copd exacerbation - Discontinue: predniSONE (DELTASONE) 20 MG tablet; Three tabs at once daily for five days. - Discontinue: doxycycline (VIBRA-TABS) 100 MG tablet; One by mouth twice a day for ten days. - doxycycline (VIBRA-TABS) 100 MG tablet; One by mouth twice a day for ten days. - predniSONE (DELTASONE) 20 MG tablet; Three tabs at once daily for five days.    Discussed continued scheduled albuterol administration. Start on prednisone burst as well as doxycycline. Offered a chest x-ray today however patient declined. He already has an appointment with his PCP in 2 days,Signs and symptoms requring emergent/urgent reevaluation were discussed with the patient.  Return in about 2 days (around 04/04/2012).

## 2012-04-04 ENCOUNTER — Ambulatory Visit: Payer: Medicare PPO | Admitting: Family Medicine

## 2012-04-04 DIAGNOSIS — Z0289 Encounter for other administrative examinations: Secondary | ICD-10-CM

## 2012-04-12 ENCOUNTER — Ambulatory Visit (INDEPENDENT_AMBULATORY_CARE_PROVIDER_SITE_OTHER): Payer: Medicare PPO | Admitting: Family Medicine

## 2012-04-12 ENCOUNTER — Telehealth: Payer: Self-pay | Admitting: Family Medicine

## 2012-04-12 ENCOUNTER — Encounter: Payer: Self-pay | Admitting: Family Medicine

## 2012-04-12 VITALS — BP 91/48 | HR 104 | Ht 68.0 in | Wt 142.0 lb

## 2012-04-12 DIAGNOSIS — D509 Iron deficiency anemia, unspecified: Secondary | ICD-10-CM

## 2012-04-12 DIAGNOSIS — R399 Unspecified symptoms and signs involving the genitourinary system: Secondary | ICD-10-CM

## 2012-04-12 DIAGNOSIS — J449 Chronic obstructive pulmonary disease, unspecified: Secondary | ICD-10-CM

## 2012-04-12 DIAGNOSIS — E611 Iron deficiency: Secondary | ICD-10-CM

## 2012-04-12 DIAGNOSIS — R3989 Other symptoms and signs involving the genitourinary system: Secondary | ICD-10-CM

## 2012-04-12 LAB — POCT URINALYSIS DIPSTICK
Bilirubin, UA: NEGATIVE
Glucose, UA: NEGATIVE
Ketones, UA: NEGATIVE
Nitrite, UA: NEGATIVE
pH, UA: 6

## 2012-04-12 NOTE — Telephone Encounter (Addendum)
Please call his Urology office for notes from his recent surgery. Also call pt and tell him to f/u for diabetes in 2 weeks.

## 2012-04-12 NOTE — Patient Instructions (Addendum)
We will call you with your lab results. If you don't here from us in about a week then please give us a call at [phone].  

## 2012-04-12 NOTE — Progress Notes (Addendum)
  Subjective:    Patient ID: Zachary Rios, male    DOB: 10-18-1935, 76 y.o.   MRN: [id number]  HPI Micah Flesher to ED  On 04/03/12 and for CP and told anemic and given 2 units of blood.  Also told had UTI and COPD exacerbation. Supposed to f/u with GI  In about 2 weeks.  He denies any blood in the stool. He says he recently had surgery for his prostate because he was having problems with his bladder. Unfortunately do not have records or notes on this. He says it was a few weeks ago and did pass blood in his urine for several days after the surgery. He was seen at [facility]. He was discharged home on October 3.  Prior hx of GI  Ulcer.    UTI-he is not sure if he took the Augmentin or not. He brought in his prescription bottles with him today but was not in the back. He says is Dr. Gean Birchwood his medications for him. He denies any dysuria or hematuria since she's been home from the hospital.  Review of Systems     Objective:   Physical Exam  Constitutional: He is oriented to person, place, and time. He appears well-developed and well-nourished.  HENT:  Head: Normocephalic and atraumatic.  Cardiovascular: Normal rate, regular rhythm and normal heart sounds.   Pulmonary/Chest: Effort normal and breath sounds normal.       + diffuse rhonchi   Neurological: He is alert and oriented to person, place, and time.  Skin: Skin is warm and dry.  Psychiatric: He has a normal mood and affect. His behavior is normal.          Assessment & Plan:  UTI - we did call the pharmacy and he did fill the prescription for Augmentin so most likely he did complete it. He did complete the prednisone in a through that into bottle way for him. We will repeat a urinalysis today. It was positive for trace leukocytes and blood. We'll send for culture to confirm that he is cleared the infection.  COPD-he's doing well today. He denies any increase shortness of breath or changes in his breathing.  Iron deficiency  anemia-he did have a prescription for iron tablets in his back and has an appointment with GI in about 2 weeks. We'll call and get records from hospital for discharge.  Addendum: Received notes from [facility].  He was d/c with CP, COPD, Klebsiella Pneumonia UTI,  Anemia fo chronic dx,

## 2012-04-13 ENCOUNTER — Telehealth: Payer: Self-pay | Admitting: Family Medicine

## 2012-04-13 LAB — COMPLETE METABOLIC PANEL WITH GFR
AST: 12 U/L (ref 0–37)
Albumin: 4 g/dL (ref 3.5–5.2)
Alkaline Phosphatase: 55 U/L (ref 39–117)
BUN: 45 mg/dL — ABNORMAL HIGH (ref 6–23)
Potassium: 5.5 mEq/L — ABNORMAL HIGH (ref 3.5–5.3)
Sodium: 135 mEq/L (ref 135–145)
Total Bilirubin: 0.4 mg/dL (ref 0.3–1.2)

## 2012-04-13 LAB — CBC WITH DIFFERENTIAL/PLATELET
Basophils Absolute: 0 10*3/uL (ref 0.0–0.1)
Basophils Relative: 0 % (ref 0–1)
Eosinophils Absolute: 0.5 10*3/uL (ref 0.0–0.7)
MCH: 29.7 pg (ref 26.0–34.0)
MCHC: 33 g/dL (ref 30.0–36.0)
Neutro Abs: 10.2 10*3/uL — ABNORMAL HIGH (ref 1.7–7.7)
Neutrophils Relative %: 74 % (ref 43–77)
RDW: 15.7 % — ABNORMAL HIGH (ref 11.5–15.5)

## 2012-04-13 NOTE — Telephone Encounter (Signed)
requested

## 2012-04-13 NOTE — Telephone Encounter (Signed)
Records request from [facility].

## 2012-04-13 NOTE — Telephone Encounter (Signed)
Please call for hospital discharge summary from 10 /3

## 2012-04-14 ENCOUNTER — Other Ambulatory Visit: Payer: Self-pay | Admitting: Family Medicine

## 2012-04-14 LAB — URINE CULTURE
Colony Count: NO GROWTH
Organism ID, Bacteria: NO GROWTH

## 2012-04-16 ENCOUNTER — Other Ambulatory Visit: Payer: Self-pay | Admitting: Family Medicine

## 2012-04-18 ENCOUNTER — Encounter: Payer: Self-pay | Admitting: Family Medicine

## 2012-04-18 NOTE — Telephone Encounter (Signed)
Called to get notes from [facility]. Spoke with Marcelino Duster

## 2012-04-18 NOTE — Telephone Encounter (Signed)
Please call again I have not received the notes yet.

## 2012-04-19 ENCOUNTER — Encounter: Payer: Self-pay | Admitting: Family Medicine

## 2012-04-25 ENCOUNTER — Other Ambulatory Visit: Payer: Self-pay | Admitting: Family Medicine

## 2012-05-01 ENCOUNTER — Ambulatory Visit (INDEPENDENT_AMBULATORY_CARE_PROVIDER_SITE_OTHER): Payer: Medicare PPO | Admitting: Family Medicine

## 2012-05-01 ENCOUNTER — Ambulatory Visit (INDEPENDENT_AMBULATORY_CARE_PROVIDER_SITE_OTHER): Payer: Medicare PPO

## 2012-05-01 ENCOUNTER — Encounter: Payer: Self-pay | Admitting: Family Medicine

## 2012-05-01 ENCOUNTER — Other Ambulatory Visit: Payer: Self-pay | Admitting: Family Medicine

## 2012-05-01 VITALS — BP 83/43 | HR 109 | Ht 68.0 in | Wt 142.0 lb

## 2012-05-01 DIAGNOSIS — N400 Enlarged prostate without lower urinary tract symptoms: Secondary | ICD-10-CM

## 2012-05-01 DIAGNOSIS — J449 Chronic obstructive pulmonary disease, unspecified: Secondary | ICD-10-CM

## 2012-05-01 DIAGNOSIS — R7301 Impaired fasting glucose: Secondary | ICD-10-CM

## 2012-05-01 DIAGNOSIS — Z85118 Personal history of other malignant neoplasm of bronchus and lung: Secondary | ICD-10-CM

## 2012-05-01 DIAGNOSIS — I10 Essential (primary) hypertension: Secondary | ICD-10-CM

## 2012-05-01 DIAGNOSIS — E119 Type 2 diabetes mellitus without complications: Secondary | ICD-10-CM

## 2012-05-01 DIAGNOSIS — D509 Iron deficiency anemia, unspecified: Secondary | ICD-10-CM

## 2012-05-01 DIAGNOSIS — R05 Cough: Secondary | ICD-10-CM

## 2012-05-01 DIAGNOSIS — G4700 Insomnia, unspecified: Secondary | ICD-10-CM

## 2012-05-01 MED ORDER — DOXYCYCLINE HYCLATE 100 MG PO TABS: 100.0000 mg | ORAL_TABLET | Freq: Two times a day (BID) | ORAL | Status: DC

## 2012-05-01 NOTE — Progress Notes (Signed)
  Subjective:    Patient ID: Zachary Rios, male    DOB: [birth year], 76 y.o.   MRN: [id number]  HPI Has CHF but feel recently and BP is low today.  Says lost Zachary balance when putting Zachary pants on . Denies any dizziness or lightheadeness. No CP or SOB. Feels Zachary urinary sxs are improved.  He says when he went to another doctor couple days ago Zachary systolic blood pressure was around 106.  COPD-he has been coughing for the last 2-3 days. Denies any fever or cold symptoms. He has been coughing up some phlegm. Unsure what color. No change in shortness of breath. He wants and if he can start using ease cigarettes instead of smoking.  He would also like me to look at Zachary medications to see if we can discontinue anything. He feels like he takes way too many pills.  Review of Systems     Objective:   Physical Exam  Constitutional: He is oriented to person, place, and time. He appears well-developed and well-nourished.  HENT:  Head: Normocephalic and atraumatic.  Right Ear: External ear normal.  Left Ear: External ear normal.  Nose: Nose normal.  Mouth/Throat: Oropharynx is clear and moist.       TMs and canals are clear.   Eyes: Conjunctivae normal and EOM are normal. Pupils are equal, round, and reactive to light.  Neck: Neck supple. No thyromegaly present.  Cardiovascular: Normal rate and normal heart sounds.   Pulmonary/Chest: Effort normal.       Expiratory wheezing with diffuse rhonchi.  Lymphadenopathy:    He has no cervical adenopathy.  Neurological: He is alert and oriented to person, place, and time.  Skin: Skin is warm and dry.  Psychiatric: He has a normal mood and affect.          Assessment & Plan:  Hypertension - Low today.  wil dec lasix to every other day. Adjust potassium as well. He does have congestive heart failure so we will need to monitor for volume overload. Right now the am very concerned about Zachary low blood pressures.  Insomnia - he had temazepam and alprazolam  but listed for insomnia. I put a rubber band around both of these and put a note on it for Zachary Rios. Will stop the tamezepam and use xanax for bedtime.  Discussed can't be on both, Inc risk of falls  Iron deficiency anemia-He is on 2 iron tabs.  Just needs to take one.    COPD-based on Zachary lung exam today would like to get a chest x-ray. He's noticed increasing cough for the last couple of days and I do hear rhonchi and wheezing on exam. He says he has been using Zachary inhalers regularly.  BPH-I. did encourage him to call the urologist to see if he discontinued Zachary terazosin since he has had a TURP this summer. I hesitate to discontinue this without discussing with the urologist.  Impaired fasting glucose-he is never had an A1c over 6.5 here in our office. During Zachary hospital stay he was placed on Glucotrol XL 5 mg daily. Zachary A1c today is 6.0 so at like to try stopping the Glucotrol and see if he is able to maintain an A1c under 6.5. This would potentially help get rid of at least one more medication.

## 2012-05-01 NOTE — Patient Instructions (Addendum)
Decrease lasix (furosemide) to one every other day. Then take the Potassium only when take the furosemide.  He has 2 different types of iron tablets. I would abandon them together. He can finish out a prescription version first and then take the over-the-counter but does not need to take those. I also discussed with him that he needs to either use alprazolam or temazepam for sleep. He should not be mixing them are taking them together. He has about 4 temazepam capsules left. He can finish this out and then throw the bottle away and use alprazolam only for sleep, at bedtime. Discontinue glipizide. We will recheck an A1c to follow your blood sugars in about 3 months off the medication.

## 2012-05-05 ENCOUNTER — Other Ambulatory Visit: Payer: Self-pay | Admitting: Family Medicine

## 2012-05-10 ENCOUNTER — Ambulatory Visit (INDEPENDENT_AMBULATORY_CARE_PROVIDER_SITE_OTHER): Payer: Medicare PPO | Admitting: Family Medicine

## 2012-05-10 ENCOUNTER — Encounter: Payer: Self-pay | Admitting: Family Medicine

## 2012-05-10 VITALS — BP 92/47 | HR 100 | Ht 68.0 in | Wt 142.0 lb

## 2012-05-10 DIAGNOSIS — J209 Acute bronchitis, unspecified: Secondary | ICD-10-CM

## 2012-05-10 DIAGNOSIS — J440 Chronic obstructive pulmonary disease with acute lower respiratory infection: Secondary | ICD-10-CM

## 2012-05-10 NOTE — Progress Notes (Signed)
  Subjective:    Patient ID: Zachary Rios, male    DOB: 11-20-1935, 76 y.o.   MRN: [id number]  HPI He is here to followup for acute bronchitis complicated by COPD exacerbation-he was started on doxycycline. He says he has one tab left. He says he is feeling significantly better. He is breathing better he is not nearly as short of breath. His cough has improved. No fever. He is using his nebulizer about twice a day.   Review of Systems     Objective:   Physical Exam  Constitutional: He is oriented to person, place, and time. He appears well-developed and well-nourished.  HENT:  Head: Normocephalic and atraumatic.  Right Ear: External ear normal.  Left Ear: External ear normal.  Nose: Nose normal.  Mouth/Throat: Oropharynx is clear and moist.       TMs and canals are clear.   Eyes: Conjunctivae normal and EOM are normal. Pupils are equal, round, and reactive to light.  Neck: Neck supple. No thyromegaly present.  Cardiovascular: Normal rate and normal heart sounds.   Pulmonary/Chest: Effort normal. No respiratory distress.       Diffuse rhonchi. No crackles. No wheezing.  Lymphadenopathy:    He has no cervical adenopathy.  Neurological: He is alert and oriented to person, place, and time.  Skin: Skin is warm and dry.  Psychiatric: He has a normal mood and affect.          Assessment & Plan:  Acute bronchitis/acute COPD exacerbation-improving. he's doing much better on the doxycycline. His lungs do sound better today. He does not seem short of breath with speech. He still has some diffuse rhonchi but no wheezing. I would really like to put him on a controller medication for his COPD such as Spiriva or tudorza. Patient declines at this time because he says he cannot afford it. He also declines a flu vaccine because he says he just doesn't want to have it. Followup in 3 months. I strongly encouraged him to call me if he feels that he started to develop a cough again or becomes more short  of breath.

## 2012-05-21 ENCOUNTER — Telehealth: Payer: Self-pay | Admitting: *Deleted

## 2012-05-21 ENCOUNTER — Ambulatory Visit (INDEPENDENT_AMBULATORY_CARE_PROVIDER_SITE_OTHER): Payer: Medicare PPO

## 2012-05-21 ENCOUNTER — Encounter: Payer: Self-pay | Admitting: Family Medicine

## 2012-05-21 ENCOUNTER — Ambulatory Visit (INDEPENDENT_AMBULATORY_CARE_PROVIDER_SITE_OTHER): Payer: Medicare PPO | Admitting: Family Medicine

## 2012-05-21 ENCOUNTER — Other Ambulatory Visit: Payer: Self-pay | Admitting: Family Medicine

## 2012-05-21 VITALS — BP 88/45 | HR 112 | Wt 140.0 lb

## 2012-05-21 DIAGNOSIS — J4489 Other specified chronic obstructive pulmonary disease: Secondary | ICD-10-CM

## 2012-05-21 DIAGNOSIS — G4700 Insomnia, unspecified: Secondary | ICD-10-CM

## 2012-05-21 DIAGNOSIS — J449 Chronic obstructive pulmonary disease, unspecified: Secondary | ICD-10-CM

## 2012-05-21 DIAGNOSIS — R0602 Shortness of breath: Secondary | ICD-10-CM

## 2012-05-21 DIAGNOSIS — J441 Chronic obstructive pulmonary disease with (acute) exacerbation: Secondary | ICD-10-CM

## 2012-05-21 DIAGNOSIS — Z720 Tobacco use: Secondary | ICD-10-CM

## 2012-05-21 DIAGNOSIS — F17200 Nicotine dependence, unspecified, uncomplicated: Secondary | ICD-10-CM

## 2012-05-21 MED ORDER — ALBUTEROL SULFATE (5 MG/ML) 0.5% IN NEBU
2.5000 mg | INHALATION_SOLUTION | Freq: Once | RESPIRATORY_TRACT | Status: AC
  Administered 2012-05-21: 2.5 mg via RESPIRATORY_TRACT

## 2012-05-21 MED ORDER — PREDNISONE 20 MG PO TABS: ORAL_TABLET | ORAL | Status: DC

## 2012-05-21 MED ORDER — TEMAZEPAM 15 MG PO CAPS: ORAL_CAPSULE | ORAL | Status: DC

## 2012-05-21 NOTE — Telephone Encounter (Signed)
Daughter calls and states that pt is really SOB, had some cough yesterday, some dizziness. Going to see heart doctor tomorrow. Please advise. Feels like wanting to pass out more frequently. Sitting down with O2 on @ 3L via nasal cannula.

## 2012-05-21 NOTE — Telephone Encounter (Signed)
Pt will come in today at 345pm °

## 2012-05-21 NOTE — Telephone Encounter (Signed)
Can he come in today?

## 2012-05-21 NOTE — Progress Notes (Signed)
  Subjective:    Patient ID: Zachary Rios, male    DOB: Nov 08, 1935, 76 y.o.   MRN: [id number]  HPI SOB x 2-3 days.  Has been wheezing as well.  No fever or URI sxs. +productive cough but doesn't feel it is worse than usual.  Tx for acute bronchtiis about 2 weeks ago. Says he completed the antibiotic.  Says the xanax helps him breath. Has appt with cardiology tomorrow.    Not sleeping well. We had stopped her tamezepam to simplify his meds. Says hasn't slwpt well without it. Says has tried the xanax in its place adn says it doesn't help him sleep. He wants to go back on his old med for sleep  Review of Systems     Objective:   Physical Exam  Constitutional: He is oriented to person, place, and time. He appears well-developed and well-nourished.  HENT:  Head: Normocephalic and atraumatic.  Cardiovascular: Normal rate, regular rhythm and normal heart sounds.   Pulmonary/Chest: Effort normal and breath sounds normal.  Neurological: He is alert and oriented to person, place, and time.  Skin: Skin is warm and dry.  Psychiatric: He has a normal mood and affect. His behavior is normal.          Assessment & Plan:  COPD exacerbation -Start prednisone. Given neb in the office w/ albuterol and atrovent and he did sound better post neb.  Recommend start home nebs 4 x a day until feeling better. CXR ordered. . Will call with results.   Insomnia - restart tamezepam  Tob Abuse - He is asking about e-cigs. His daughter bought him one to use.  He wanst to mae sure it is ok ot use.

## 2012-05-22 ENCOUNTER — Telehealth: Payer: Self-pay

## 2012-05-22 MED ORDER — ALBUTEROL SULFATE (2.5 MG/3ML) 0.083% IN NEBU: 2.5000 mg | INHALATION_SOLUTION | Freq: Four times a day (QID) | RESPIRATORY_TRACT | Status: DC | PRN

## 2012-05-22 NOTE — Telephone Encounter (Signed)
Message copied by Chalmers Cater on Tue May 22, 2012  5:02 PM ------      Message from: Nani Gasser D      Created: Mon May 21, 2012  8:32 PM       No infection on Chest xray. Recommend use neb machine 4 times a day until feeling much better. Make sure taking prednisone.  If start coughing more or notice change in sputum then call and I will add an antibiotic.

## 2012-05-29 ENCOUNTER — Ambulatory Visit: Payer: Medicare PPO | Admitting: Family Medicine

## 2012-06-04 ENCOUNTER — Ambulatory Visit: Payer: Medicare PPO | Admitting: Family Medicine

## 2012-06-04 DIAGNOSIS — Z0289 Encounter for other administrative examinations: Secondary | ICD-10-CM

## 2012-06-05 ENCOUNTER — Telehealth: Payer: Self-pay | Admitting: *Deleted

## 2012-06-05 MED ORDER — BUDESONIDE-FORMOTEROL FUMARATE 160-4.5 MCG/ACT IN AERO: 2.0000 | INHALATION_SPRAY | Freq: Two times a day (BID) | RESPIRATORY_TRACT | Status: DC

## 2012-06-05 NOTE — Telephone Encounter (Signed)
Unfortunately we cannot continue steroids. The long-term effects aren't bad. They really should only be used in acute situations. But we can do is put him on an inhaled corticosteroid that works just locally in the lungs has not systemic. This will be a product such as Advair or Spiriva or Dulera. I will send a prescription for these over to his pharmacy.

## 2012-06-05 NOTE — Telephone Encounter (Signed)
Finished the prednisone last Wednesday and states that they helped his breathing a lot. Wants a refill. Please advise. Having SOB when goes up and down stairs or walks a long distance

## 2012-06-06 MED ORDER — FLUTICASONE-SALMETEROL 250-50 MCG/DOSE IN AEPB: 1.0000 | INHALATION_SPRAY | Freq: Two times a day (BID) | RESPIRATORY_TRACT | Status: DC

## 2012-06-06 NOTE — Telephone Encounter (Signed)
Zachary Rios is not sure if he can afford any of the prescriptions. Dr Linford Arnold suggested Advair 250/50 samples. Zachary Rios is aware and will come by to pick up samples.

## 2012-06-18 ENCOUNTER — Other Ambulatory Visit: Payer: Self-pay | Admitting: Family Medicine

## 2012-06-25 ENCOUNTER — Other Ambulatory Visit: Payer: Self-pay | Admitting: Family Medicine

## 2012-06-26 ENCOUNTER — Other Ambulatory Visit: Payer: Self-pay | Admitting: Family Medicine

## 2012-06-28 ENCOUNTER — Other Ambulatory Visit: Payer: Self-pay | Admitting: *Deleted

## 2012-06-28 MED ORDER — ALBUTEROL SULFATE (2.5 MG/3ML) 0.083% IN NEBU: 2.5000 mg | INHALATION_SOLUTION | Freq: Four times a day (QID) | RESPIRATORY_TRACT | Status: DC | PRN

## 2012-07-03 ENCOUNTER — Telehealth: Payer: Self-pay | Admitting: Family Medicine

## 2012-07-03 NOTE — Telephone Encounter (Signed)
I called patient as you had asked me to. Patient states that he wants to wait until after he has his pace maker put in on Jan 23rd before he sees [facility] in [place]. Just wanted to update you on what he is thinking. Thanks,Jennifer

## 2012-07-06 ENCOUNTER — Ambulatory Visit (INDEPENDENT_AMBULATORY_CARE_PROVIDER_SITE_OTHER): Payer: Medicare PPO | Admitting: Physician Assistant

## 2012-07-06 ENCOUNTER — Encounter: Payer: Self-pay | Admitting: Physician Assistant

## 2012-07-06 VITALS — BP 108/46 | HR 80 | Ht 68.0 in | Wt 145.0 lb

## 2012-07-06 DIAGNOSIS — L309 Dermatitis, unspecified: Secondary | ICD-10-CM

## 2012-07-06 DIAGNOSIS — L259 Unspecified contact dermatitis, unspecified cause: Secondary | ICD-10-CM

## 2012-07-06 MED ORDER — TRIAMCINOLONE 0.1 % CREAM:EUCERIN CREAM 1:1: 1.0000 "application " | TOPICAL_CREAM | Freq: Two times a day (BID) | CUTANEOUS | Status: AC

## 2012-07-06 NOTE — Patient Instructions (Addendum)
Benadryl twice a day. Start using cream twice a day. Call if not improving in next 2 weeks.   Eurcerin/Cetaphil/Aveeno  Eczema Atopic dermatitis, or eczema, is an inherited type of sensitive skin. Often people with eczema have a family history of allergies, asthma, or hay fever. It causes a red itchy rash and dry scaly skin. The itchiness may occur before the skin rash and may be very intense. It is not contagious. Eczema is generally worse during the cooler winter months and often improves with the warmth of summer. Eczema usually starts showing signs in infancy. Some children outgrow eczema, but it may last through adulthood. Flare-ups may be caused by:  Eating something or contact with something you are sensitive or allergic to.  Stress. DIAGNOSIS  The diagnosis of eczema is usually based upon symptoms and medical history. TREATMENT  Eczema cannot be cured, but symptoms usually can be controlled with treatment or avoidance of allergens (things to which you are sensitive or allergic to).  Controlling the itching and scratching.  Use over-the-counter antihistamines as directed for itching. It is especially useful at night when the itching tends to be worse.  Use over-the-counter steroid creams as directed for itching.  Scratching makes the rash and itching worse and may cause impetigo (a skin infection) if fingernails are contaminated (dirty).  Keeping the skin well moisturized with creams every day. This will seal in moisture and help prevent dryness. Lotions containing alcohol and water can dry the skin and are not recommended.  Limiting exposure to allergens.  Recognizing situations that cause stress.  Developing a plan to manage stress. HOME CARE INSTRUCTIONS   Take prescription and over-the-counter medicines as directed by your caregiver.  Do not use anything on the skin without checking with your caregiver.  Keep baths or showers short (5 minutes) in warm (not hot) water.  Use mild cleansers for bathing. You may add non-perfumed bath oil to the bath water. It is best to avoid soap and bubble bath.  Immediately after a bath or shower, when the skin is still damp, apply a moisturizing ointment to the entire body. This ointment should be a petroleum ointment. This will seal in moisture and help prevent dryness. The thicker the ointment the better. These should be unscented.  Keep fingernails cut short and wash hands often. If your child has eczema, it may be necessary to put soft gloves or mittens on your child at night.  Dress in clothes made of cotton or cotton blends. Dress lightly, as heat increases itching.  Avoid foods that may cause flare-ups. Common foods include cow's milk, peanut butter, eggs and wheat.  Keep a child with eczema away from anyone with fever blisters. The virus that causes fever blisters (herpes simplex) can cause a serious skin infection in children with eczema. SEEK MEDICAL CARE IF:   Itching interferes with sleep.  The rash gets worse or is not better within one week following treatment.  The rash looks infected (pus or soft yellow scabs).  You or your child has an oral temperature above 102 F (38.9 C).  Your baby is older than 3 months with a rectal temperature of 100.5 F (38.1 C) or higher for more than 1 day.  The rash flares up after contact with someone who has fever blisters. SEEK IMMEDIATE MEDICAL CARE IF:   Your baby is older than 3 months with a rectal temperature of 102 F (38.9 C) or higher.  Your baby is older than 3 months or  younger with a rectal temperature of 100.4 F (38 C) or higher. Document Released: 06/17/2000 Document Revised: 09/12/2011 Document Reviewed: 04/22/2009 [facility] Patient Information 2013 [place], [place].

## 2012-07-06 NOTE — Progress Notes (Signed)
  Subjective:    Patient ID: Zachary Rios, male    DOB: September 26, 1935, 77 y.o.   MRN: [id number]  HPI Patient is a 77 year old male who presents to the clinic with itching on his chest and back for 5 days. He has tried regular lotion on it it has not helped. He went to the pharmacy and picked up cream for dry skin and has also not helped. He denies starting any new medication or changing detergent. He does not know of anything new that has concomitant contact with the skin. There is no pain associated with rash. He denies any fever, chills, muscle aches, burning, sore throat, ear pain.     Review of Systems     Objective:   Physical Exam  Constitutional: He is oriented to person, place, and time. He appears well-developed and well-nourished.  Cardiovascular: Normal rate and normal heart sounds.   Pulmonary/Chest: Effort normal and breath sounds normal. He has no wheezes.  Neurological: He is alert and oriented to person, place, and time.  Skin:     Psychiatric: He has a normal mood and affect. His behavior is normal.          Assessment & Plan:   Dermatitis-patient was instructed to pick up some Benadryl at pharmacy 25mg  twice daily for itching. Consider Zyrtec or Claritin if bendadryl making too sleepy. Start Eurcerin/Triamcinolone cream twice daily. If not improving in the next 2 weeks call office. Patient given handout for eczema and symptomatic things to make dry skin better. Encouraged patient not to use regular lotions but to use lotion such as Cetaphil, Aveeno and Eucerin.

## 2012-07-17 ENCOUNTER — Other Ambulatory Visit: Payer: Self-pay | Admitting: *Deleted

## 2012-07-17 MED ORDER — ALBUTEROL SULFATE (2.5 MG/3ML) 0.083% IN NEBU: 2.5000 mg | INHALATION_SOLUTION | Freq: Four times a day (QID) | RESPIRATORY_TRACT | Status: AC | PRN

## 2012-07-26 ENCOUNTER — Encounter: Payer: Self-pay | Admitting: Family Medicine

## 2012-07-26 HISTORY — PX: PACEMAKER INSERTION: SHX728

## 2012-07-29 ENCOUNTER — Other Ambulatory Visit: Payer: Self-pay | Admitting: Family Medicine

## 2012-08-01 ENCOUNTER — Ambulatory Visit: Payer: Medicare PPO | Admitting: Family Medicine

## 2012-08-14 ENCOUNTER — Other Ambulatory Visit: Payer: Self-pay | Admitting: Family Medicine

## 2012-08-14 NOTE — Telephone Encounter (Signed)
Let call the patient to verify which one he is using for sleep. I am not sure.

## 2012-08-14 NOTE — Telephone Encounter (Signed)
Ok to fill this? I see he's also on temazapam for sleep.

## 2012-08-17 ENCOUNTER — Encounter: Payer: Self-pay | Admitting: Family Medicine

## 2012-08-17 ENCOUNTER — Other Ambulatory Visit: Payer: Self-pay | Admitting: *Deleted

## 2012-08-17 DIAGNOSIS — Z950 Presence of cardiac pacemaker: Secondary | ICD-10-CM | POA: Insufficient documentation

## 2012-08-17 MED ORDER — ALPRAZOLAM 0.5 MG PO TABS: ORAL_TABLET | ORAL | Status: DC

## 2012-08-18 ENCOUNTER — Other Ambulatory Visit: Payer: Self-pay | Admitting: Family Medicine

## 2012-08-29 ENCOUNTER — Other Ambulatory Visit: Payer: Self-pay | Admitting: *Deleted

## 2012-08-29 MED ORDER — TERAZOSIN HCL 5 MG PO CAPS: 5.0000 mg | ORAL_CAPSULE | Freq: Two times a day (BID) | ORAL | Status: DC

## 2012-08-29 NOTE — Telephone Encounter (Signed)
Med refilled.

## 2012-08-31 ENCOUNTER — Other Ambulatory Visit: Payer: Self-pay | Admitting: Family Medicine

## 2012-10-05 ENCOUNTER — Other Ambulatory Visit: Payer: Self-pay | Admitting: Family Medicine

## 2012-10-18 ENCOUNTER — Other Ambulatory Visit: Payer: Self-pay | Admitting: Family Medicine

## 2012-10-23 ENCOUNTER — Ambulatory Visit (INDEPENDENT_AMBULATORY_CARE_PROVIDER_SITE_OTHER): Payer: Medicare PPO

## 2012-10-23 ENCOUNTER — Ambulatory Visit (INDEPENDENT_AMBULATORY_CARE_PROVIDER_SITE_OTHER): Payer: Medicare PPO | Admitting: Family Medicine

## 2012-10-23 ENCOUNTER — Encounter: Payer: Self-pay | Admitting: Family Medicine

## 2012-10-23 VITALS — BP 97/45 | HR 113 | Wt 144.0 lb

## 2012-10-23 DIAGNOSIS — R7301 Impaired fasting glucose: Secondary | ICD-10-CM

## 2012-10-23 DIAGNOSIS — I509 Heart failure, unspecified: Secondary | ICD-10-CM

## 2012-10-23 DIAGNOSIS — M542 Cervicalgia: Secondary | ICD-10-CM

## 2012-10-23 DIAGNOSIS — J449 Chronic obstructive pulmonary disease, unspecified: Secondary | ICD-10-CM

## 2012-10-23 DIAGNOSIS — Z87891 Personal history of nicotine dependence: Secondary | ICD-10-CM

## 2012-10-23 DIAGNOSIS — M899 Disorder of bone, unspecified: Secondary | ICD-10-CM

## 2012-10-23 DIAGNOSIS — M949 Disorder of cartilage, unspecified: Secondary | ICD-10-CM

## 2012-10-23 DIAGNOSIS — M5030 Other cervical disc degeneration, unspecified cervical region: Secondary | ICD-10-CM

## 2012-10-23 DIAGNOSIS — E1159 Type 2 diabetes mellitus with other circulatory complications: Secondary | ICD-10-CM

## 2012-10-23 LAB — POCT UA - MICROALBUMIN: Microalbumin Ur, POC: 30 mg/dL

## 2012-10-23 MED ORDER — FLUTICASONE-SALMETEROL 250-50 MCG/DOSE IN AEPB: 1.0000 | INHALATION_SPRAY | Freq: Two times a day (BID) | RESPIRATORY_TRACT | Status: DC

## 2012-10-23 NOTE — Progress Notes (Signed)
Subjective:    Patient ID: Zachary Rios, male    DOB: 10-11-1935, 77 y.o.   MRN: [id number]  HPI Posterior neck pain. Worse when works outside. Better when sits down and extends the head. Worse with flexion. No pain with rotation. Hasn't used any pain reliever. No HA with it. No ear ringing.  NO shoulder or arm pain or numbness.  No injury.  Started a month ago.    COPD - doing ok. Occ using his inhaler.  Using some in evenings.  Needs refills for your nebs.  Quit smoking a year ago.  Says his breathing  Doesn't keep him from doing things.   Had pacemaker placed in Jan. The month before in December passed out and hit head.    DM- No woeunds that aren't healing. No hypoglycemic events.  Not on medications.     Review of Systems BP 97/45  Pulse 113  Wt 144 lb (65.318 kg)  BMI 21.9 kg/m2    No Known Allergies  Past Medical History  Diagnosis Date  . Anxiety   . Hypertension   . Diabetes mellitus   . Hyperlipidemia   . Ulcer     peptic  . COPD (chronic obstructive pulmonary disease)   . Anemia   . PAD (peripheral artery disease)   . Cancer 2011    lung    Past Surgical History  Procedure Laterality Date  . Thoracotomy  02/2010    RUL resection for lung cancer  . Transurethral resection of prostate  03/15/12    Dr. Karoline Caldwell  . Pacemaker insertion  07/26/12    Dr. Sherren Kerns    History   Social History  . Marital Status: Married    Spouse Name: N/A    Number of Children: N/A  . Years of Education: N/A   Occupational History  . Retired     Social History Main Topics  . Smoking status: Former Smoker -- 1.00 packs/day    Types: Cigarettes    Start date: 10/03/2011  . Smokeless tobacco: Former Neurosurgeon  . Alcohol Use: No  . Drug Use: No  . Sexually Active: Not on file   Other Topics Concern  . Not on file   Social History Narrative  . No narrative on file    Family History  Problem Relation Age of Onset  . Cancer Father   . Lung cancer Brother    lung    Outpatient Encounter Prescriptions as of 10/23/2012  Medication Sig Dispense Refill  . albuterol (PROAIR HFA) 108 (90 BASE) MCG/ACT inhaler Inhale 2 puffs into the lungs every 6 (six) hours as needed.        Marland Kitchen albuterol (PROVENTIL) (2.5 MG/3ML) 0.083% nebulizer solution Take 3 mLs (2.5 mg total) by nebulization every 6 (six) hours as needed for wheezing.  90 vial  3  . ALPRAZolam (XANAX) 0.5 MG tablet TAKE 1 TABLET AT BEDTIME AS NEEDED FOR SLEEP  90 tablet  0  . AMBULATORY NON FORMULARY MEDICATION Medication Name: Glucometer adn strips and lancet to test once a day.  Dx 250.00  1 Units  11  . amitriptyline (ELAVIL) 75 MG tablet TAKE 1 TABLET AT BEDTIME.  30 tablet  3  . budesonide-formoterol (SYMBICORT) 160-4.5 MCG/ACT inhaler Inhale 2 puffs into the lungs 2 (two) times daily.  1 Inhaler  12  . DIGOX 0.125 MG tablet TAKE 1 TABLET ON MONDAY,WEDNESDAY, AND FRIDAY  15 tablet  3  . docusate sodium (COLACE) 100 MG capsule  Take 100 mg by mouth 2 (two) times daily.        . Fluticasone-Salmeterol (ADVAIR DISKUS) 250-50 MCG/DOSE AEPB Inhale 1 puff into the lungs 2 (two) times daily.  14 each  1  . furosemide (LASIX) 20 MG tablet Take 20 mg by mouth every other day.      . ipratropium-albuterol (DUONEB) 0.5-2.5 (3) MG/3ML SOLN Take 3 mLs by nebulization every 6 (six) hours as needed.  360 mL  11  . iron polysaccharides (POLY-IRON 150) 150 MG capsule Take 150 mg by mouth 2 (two) times daily.        Marland Kitchen lisinopril (PRINIVIL,ZESTRIL) 5 MG tablet Take 0.5 tablets (2.5 mg total) by mouth daily.  1 tablet  0  . metoprolol succinate (TOPROL-XL) 25 MG 24 hr tablet Take 12.5 mg by mouth daily.      . pantoprazole (PROTONIX) 40 MG tablet Take 1 tablet (40 mg total) by mouth daily.  30 tablet  4  . polyethylene glycol (MIRALAX / GLYCOLAX) packet Take 17 g by mouth daily.        . potassium chloride (KLOR-CON) 20 MEQ packet Take 20 mEq by mouth every other day.       . simvastatin (ZOCOR) 40 MG tablet Take 1  tablet (40 mg total) by mouth at bedtime.  30 tablet  3  . temazepam (RESTORIL) 15 MG capsule TAKE 1 CAPSULE BY MOUTH AT BEDTIME  30 capsule  1  . terazosin (HYTRIN) 5 MG capsule TAKE ONE CAPSULE TWO TIMES A DAY  60 capsule  3  . Triamcinolone Acetonide (TRIAMCINOLONE 0.1 % CREAM : EUCERIN) CREA Apply 1 application topically 2 (two) times daily.  1 each  1  . [DISCONTINUED] doxycycline (VIBRA-TABS) 100 MG tablet Take 1 tablet (100 mg total) by mouth 2 (two) times daily.  20 tablet  0  . [DISCONTINUED] predniSONE (DELTASONE) 20 MG tablet 2 a day for 7 days, then one a day for 7 days.  21 tablet  0  . Fluticasone-Salmeterol (ADVAIR DISKUS) 250-50 MCG/DOSE AEPB Inhale 1 puff into the lungs 2 (two) times daily.  1 each  0   No facility-administered encounter medications on file as of 10/23/2012.          Objective:   Physical Exam  Constitutional: He is oriented to person, place, and time. He appears well-developed and well-nourished.  HENT:  Head: Normocephalic and atraumatic.  Eyes: Conjunctivae are normal. Pupils are equal, round, and reactive to light.  Neck: Neck supple. No thyromegaly present.  Cardiovascular: Normal rate, regular rhythm and normal heart sounds.   Pulmonary/Chest: Effort normal and breath sounds normal.  Prolonged coarse expiration.   Musculoskeletal:  Neck flexion, extension are normal with no significant discomfort or pain. Rotation right and left is slightly decreased but symmetric. Side bending is normal. Shoulders with normal range of motion. Nontender over the cervical spine. Nontender of the anterior neck. No thyromegaly.  Lymphadenopathy:    He has no cervical adenopathy.  Neurological: He is alert and oriented to person, place, and time.  Skin: Skin is warm and dry.  Psychiatric: He has a normal mood and affect. His behavior is normal.          Assessment & Plan:  Posterior neck pain  - Will get xrays since going on for a month.  His pain has been  persistent. Consider it could also be muscle spasm but I would suspect this would improve after couple weeks. He really has not  tried any medication he is able to get relief after about 60 seconds despite repositioning his head. He does have a history of lung cancer.  COPD - STable. Due for spirometry. Encouraged him to schedule next month.  HTN - BP tends to run low then skip once of BP Pills.    Doing well with pacemaker.  He says he has not had any more syncopal or shaking event since then.  Lung cancer-he saw his oncologist about 2 weeks ago and says he got a good report back. He says they did lots of blood work.  Congratulated her on being a former smoker and updated his chart.  DM- Well controlled. Off of medications. Discussed need for dietary changes. Followup in 3 months rechecking with an A1c. We'll check n Microvena today the patient wasn't sure he was able to give a sample.

## 2012-10-23 NOTE — Patient Instructions (Addendum)
If you have a day we are feeling lightheaded or dizzy and skip when your blood pressure pills for today. For example the lisinopril or the metoprolol 3 also do not recommend taking her Lasix on days where you or dizzy. We better off to waiting for the next day and see if he felt better. If the blood pressure continues to run this low we really may need to consider stopping one of the blood pressure pills. He is currently taking them for additional reasons including his congestive heart failure etc.   Please schedule followup in about 4-6 weeks for spirometry. This takes about 30 minutes and is a breathing test that we need to do yearly. His last one was in March of 2012.  Decrease lisinopril to half a tabs as well. (Blood pressure is low).    Cut back on sweets and sugary drinks. Blood work shows that sugars are starting to creep up. Make sure watch portion sizes on your bread, rice, pasta and other starchy foods. We will recheck this again in 3 months.

## 2012-10-23 NOTE — Addendum Note (Signed)
Addended by: Deno Etienne on: 10/23/2012 01:54 PM   Modules accepted: Orders

## 2012-11-12 ENCOUNTER — Other Ambulatory Visit: Payer: Self-pay | Admitting: Family Medicine

## 2012-11-12 MED ORDER — TERAZOSIN HCL 10 MG PO CAPS: 10.0000 mg | ORAL_CAPSULE | Freq: Every day | ORAL | Status: DC

## 2012-11-13 ENCOUNTER — Other Ambulatory Visit: Payer: Self-pay | Admitting: Family Medicine

## 2012-11-14 ENCOUNTER — Telehealth: Payer: Self-pay | Admitting: *Deleted

## 2012-11-14 ENCOUNTER — Other Ambulatory Visit: Payer: Self-pay | Admitting: *Deleted

## 2012-11-14 MED ORDER — TERAZOSIN HCL 10 MG PO CAPS: 10.0000 mg | ORAL_CAPSULE | Freq: Every day | ORAL | Status: DC

## 2012-11-14 NOTE — Telephone Encounter (Signed)
Called pt and advised that his medication is not covered by his insurance company and that he can get this cheaper @ [facility] if he wants. He is ok with this.Laureen Ochs, Viann Shove

## 2012-11-16 ENCOUNTER — Other Ambulatory Visit: Payer: Self-pay | Admitting: *Deleted

## 2012-11-16 MED ORDER — PANTOPRAZOLE SODIUM 40 MG PO TBEC: DELAYED_RELEASE_TABLET | ORAL | Status: DC

## 2012-11-16 MED ORDER — TEMAZEPAM 15 MG PO CAPS: 15.0000 mg | ORAL_CAPSULE | Freq: Every day | ORAL | Status: DC

## 2012-11-16 MED ORDER — LISINOPRIL 5 MG PO TABS: 2.5000 mg | ORAL_TABLET | Freq: Every day | ORAL | Status: DC

## 2012-11-16 NOTE — Telephone Encounter (Signed)
Lisinopril, temazepam, & pantoprozole sent to [facility] instead of [facility].

## 2012-12-04 ENCOUNTER — Other Ambulatory Visit: Payer: Self-pay | Admitting: Family Medicine

## 2012-12-05 ENCOUNTER — Other Ambulatory Visit: Payer: Self-pay | Admitting: Family Medicine

## 2012-12-07 ENCOUNTER — Other Ambulatory Visit: Payer: Self-pay | Admitting: Family Medicine

## 2012-12-07 ENCOUNTER — Ambulatory Visit (INDEPENDENT_AMBULATORY_CARE_PROVIDER_SITE_OTHER): Payer: Medicare HMO | Admitting: Family Medicine

## 2012-12-07 ENCOUNTER — Encounter: Payer: Self-pay | Admitting: Family Medicine

## 2012-12-07 VITALS — BP 113/53 | HR 77 | Wt 146.0 lb

## 2012-12-07 DIAGNOSIS — J4489 Other specified chronic obstructive pulmonary disease: Secondary | ICD-10-CM

## 2012-12-07 DIAGNOSIS — J449 Chronic obstructive pulmonary disease, unspecified: Secondary | ICD-10-CM

## 2012-12-07 DIAGNOSIS — Z87891 Personal history of nicotine dependence: Secondary | ICD-10-CM

## 2012-12-07 MED ORDER — BUDESONIDE-FORMOTEROL FUMARATE 160-4.5 MCG/ACT IN AERO: 2.0000 | INHALATION_SPRAY | Freq: Two times a day (BID) | RESPIRATORY_TRACT | Status: AC

## 2012-12-07 NOTE — Progress Notes (Signed)
  Subjective:    Patient ID: Zachary Rios, male    DOB: [birth year], 77 y.o.   MRN: [id number]  HPI COPD - Says not using Symbicort or Advair. Does use Duoneb once or twice daily. Says his co-pay for the prior was about $50 which is difficult for him to afford. He recently had problem with his medications. He was on hospice at one time and for some reason it popped up in the system that he was on hospice and his insurance denied all of his medications. It took [place] and the pharmacy about 2 weeks to get this corrected for him to get his medications back. He denies any recent exacerbations with his lungs. No recent change in cough or sputum or fever.   Review of Systems     Objective:   Physical Exam  Constitutional: He is oriented to person, place, and time. He appears well-developed and well-nourished.  HENT:  Head: Normocephalic and atraumatic.  Cardiovascular: Normal rate, regular rhythm and normal heart sounds.   Pulmonary/Chest: Effort normal. He has wheezes.  Diffuse expiratory wheezing  Neurological: He is alert and oriented to person, place, and time.  Skin: Skin is warm and dry.  Psychiatric: He has a normal mood and affect. His behavior is normal.          Assessment & Plan:  COPD - Severe COPD.  Pulse ox is reassuring.  REviewed results with him.  Need to restart the symbicort. Can use her Duoneb with this.  I would also like to put him on a Spiriva or tudorza but at this point in time he is really struggling financially to get his medications. I think it's good to just start with getting him back on the Symbicort. If this one is not covered or preferred with his insurance then we can try a different medication.  12/07/12 - FVC 2.84 (79%), FEV1 1.35 (48%), ratio 48%. Had a significant response to albuterol with FEV1 improvement of 29% FEF 25-75 improvement of 47% and FEC improvement of 7%.  F/U in 1 months.

## 2012-12-12 ENCOUNTER — Other Ambulatory Visit: Payer: Self-pay | Admitting: Family Medicine

## 2012-12-13 ENCOUNTER — Encounter: Payer: Self-pay | Admitting: *Deleted

## 2012-12-13 LAB — BASIC METABOLIC PANEL: Creatinine: 1.8 mg/dL — AB (ref 0.6–1.3)

## 2012-12-13 LAB — HEPATIC FUNCTION PANEL: AST: 17 U/L (ref 14–40)

## 2012-12-25 ENCOUNTER — Encounter: Payer: Self-pay | Admitting: Family Medicine

## 2012-12-25 ENCOUNTER — Ambulatory Visit (INDEPENDENT_AMBULATORY_CARE_PROVIDER_SITE_OTHER): Payer: Medicare PPO | Admitting: Family Medicine

## 2012-12-25 VITALS — BP 105/52 | HR 91 | Wt 141.0 lb

## 2012-12-25 DIAGNOSIS — I10 Essential (primary) hypertension: Secondary | ICD-10-CM

## 2012-12-25 DIAGNOSIS — I5040 Unspecified combined systolic (congestive) and diastolic (congestive) heart failure: Secondary | ICD-10-CM

## 2012-12-25 DIAGNOSIS — J449 Chronic obstructive pulmonary disease, unspecified: Secondary | ICD-10-CM

## 2012-12-25 NOTE — Progress Notes (Signed)
  Subjective:    Patient ID: Zachary Rios, male    DOB: 18-Mar-1936, 77 y.o.   MRN: [id number]  HPI COPD  - he is doing really well on the symbicort. Says hasn't had to use recue breathing treatments last week.  Not having to use his oxygen in the AM. He saw his oncologist last week. He says they did blood work and then called him and told him to stop his Lasix. He was only taking it twice a week as he does have a history of congestive heart failure. He also thinks he was told to stop the metoprolol. He wasn't completely sure about the name of the drug but it was not in his back of medications today. Also the digoxin was missing from his back but he says he does take that on Monday Wednesdays and Fridays.  HTN - Well controlled.  In fact blood pressure is a little bit low for his age but unfortunately he is on low doses of ACE inhibitor, beta blocker and diuretic because of his congestive heart failure. Though at some point the risk of hypotension may be more concerning than treatment for his congestive heart failure. He denies any lightheadedness dizziness or chest pain.   Review of Systems     Objective:   Physical Exam  Constitutional: He is oriented to person, place, and time. He appears well-developed and well-nourished.  HENT:  Head: Normocephalic and atraumatic.  Cardiovascular: Normal rate, regular rhythm and normal heart sounds.   Pulmonary/Chest: Effort normal and breath sounds normal.  Neurological: He is alert and oriented to person, place, and time.  Skin: Skin is warm and dry.  Psychiatric: He has a normal mood and affect. His behavior is normal.          Assessment & Plan:  COPD - well-controlled. He is doing fantastic on the Symbicort has not had to use rescue treatment. He says it has made a bid difference in how he feels on a daily basis. His lungs sound fantastic today. Given a sample of Symbicort today. Call if any problems or side effects otherwise followup in 3  months.  HTN- well controlled.  In fact BP is a little low.    CHF-he does have a history of congestive heart failure and had an episode of acute pulmonary edema about a year ago. That was when he was originally started on Lasix. Over time we had decreased it down to twice a week because he was tolerating a low dose and it was controlling his symptoms. He has not had any exacerbations since that time. I am a little bit nervous about taking him completely off the Lasix so I encouraged him to call me immediately if he notices any change in his breathing increased shortness of breath works or swelling of the extremities.

## 2012-12-26 ENCOUNTER — Telehealth: Payer: Self-pay | Admitting: *Deleted

## 2012-12-26 NOTE — Telephone Encounter (Signed)
Called to obtain a copy of pt's labs for DOS 06.12.2014 unable to speak with anyone so I faxed over a request asking for pt's labs [phone]).Laureen Ochs, Viann Shove

## 2012-12-26 NOTE — Telephone Encounter (Signed)
Pt calls and wants to know what his lab results were?

## 2012-12-27 ENCOUNTER — Other Ambulatory Visit: Payer: Self-pay | Admitting: Family Medicine

## 2012-12-27 ENCOUNTER — Telehealth: Payer: Self-pay | Admitting: Family Medicine

## 2012-12-27 DIAGNOSIS — E876 Hypokalemia: Secondary | ICD-10-CM

## 2012-12-27 NOTE — Telephone Encounter (Signed)
LMOM of still waiting on results. Barry Dienes, LPN

## 2012-12-27 NOTE — Telephone Encounter (Signed)
[facility] faxed request 2 days in a row.  Still awaiting labs from his oncologist.

## 2012-12-27 NOTE — Telephone Encounter (Addendum)
Call patient: I did get the copy of lab results that were done through Dr. Abbe Amsterdam office. His potassium was still high. It is actually higher than it was back in October when we did blood work on him. He needs to stop his lisinopril completely. This can sometimes cause elevations in potassium. We do need to recheck his potassium in about one to 2 weeks. I believe he also stopped his metoprolol but he can restart back. He can continue to hold the Lasix  And potassium as well. You may need to talk to his daughter about this as I believe she fixes his medications for him.

## 2012-12-28 NOTE — Telephone Encounter (Signed)
Pt called and informed. I also called pt's daughter Arline Asp and told her of the changes to his meds and for her to bring him in for recheck of K. She voiced understanding and agreed.Heath Gold Order placed.Loralee Pacas [place]

## 2012-12-29 ENCOUNTER — Other Ambulatory Visit: Payer: Self-pay | Admitting: Family Medicine

## 2013-01-03 ENCOUNTER — Other Ambulatory Visit: Payer: Self-pay | Admitting: *Deleted

## 2013-01-03 DIAGNOSIS — E876 Hypokalemia: Secondary | ICD-10-CM

## 2013-01-06 ENCOUNTER — Other Ambulatory Visit: Payer: Self-pay | Admitting: Family Medicine

## 2013-01-07 ENCOUNTER — Ambulatory Visit (INDEPENDENT_AMBULATORY_CARE_PROVIDER_SITE_OTHER): Payer: Medicare PPO | Admitting: Family Medicine

## 2013-01-07 ENCOUNTER — Other Ambulatory Visit: Payer: Self-pay | Admitting: *Deleted

## 2013-01-07 ENCOUNTER — Encounter: Payer: Self-pay | Admitting: Family Medicine

## 2013-01-07 VITALS — BP 117/57 | HR 75 | Wt 146.0 lb

## 2013-01-07 DIAGNOSIS — E1159 Type 2 diabetes mellitus with other circulatory complications: Secondary | ICD-10-CM

## 2013-01-07 DIAGNOSIS — I959 Hypotension, unspecified: Secondary | ICD-10-CM

## 2013-01-07 DIAGNOSIS — E875 Hyperkalemia: Secondary | ICD-10-CM

## 2013-01-07 DIAGNOSIS — J449 Chronic obstructive pulmonary disease, unspecified: Secondary | ICD-10-CM

## 2013-01-07 DIAGNOSIS — E876 Hypokalemia: Secondary | ICD-10-CM

## 2013-01-07 LAB — POCT GLYCOSYLATED HEMOGLOBIN (HGB A1C): Hemoglobin A1C: 6.7

## 2013-01-07 NOTE — Patient Instructions (Signed)
Cut your lisinopril in half and make sure not taking any potassium pills.  Then lets recheck you potassium in one week. Can go to the lab for this.

## 2013-01-07 NOTE — Progress Notes (Signed)
  Subjective:    Patient ID: Zachary Rios, male    DOB: [birth year], 77 y.o.   MRN: [id number]  HPI Hypotension-he completely stopped his Lasix. There were also some other adjustments to his medication. He still taking lisinopril 5 mg daily. He overall says he's feeling fine. No lightheadedness or dizziness. No chest pain or shortness of breath. He's here to recheck blood pressure today. His last blood pressure was a systolic of 88. He denies any recent swelling or changes in breathing.  COPD - doing well on Symbicort. He says it made a big difference in his daily breathing. He's not had any exacerbations since I last saw him.   Review of Systems     Objective:   Physical Exam  Constitutional: He is oriented to person, place, and time. He appears well-developed and well-nourished.  HENT:  Head: Normocephalic and atraumatic.  Cardiovascular: Normal rate, regular rhythm and normal heart sounds.   Pulmonary/Chest: Effort normal and breath sounds normal.  Musculoskeletal: He exhibits no edema.  Neurological: He is alert and oriented to person, place, and time.  Skin: Skin is warm and dry.  Psychiatric: He has a normal mood and affect. His behavior is normal.          Assessment & Plan:  Hypotension-blood pressures much better today, then the week ago. I would still like him to try cut the lisinopril and half and then recheck potassium. No sign of volume overload on exam today.  Congestive heart failure-because of congestive heart failure he really needs to be on ACE inhibitor but his blood pressures have been so low that he may end up having to stop it to help maintain a normal blood pressure level. For now just have him cut the 5 mg in half and will recheck potassium level.  COPD-doing well and Symbicort. Samples given today. He has not had any exacerbations since I last saw him.  Hypokalemia-we will call his daughter to make sure that he is completely off of any potassium supplement in  addition to cutting the lisinopril in half. Now that he is off the Lasix this may be leading to some hyperkalemia. We'll recheck level in one week.  Followup in 3 months for diabetes et Karie Soda.

## 2013-01-08 ENCOUNTER — Encounter: Payer: Self-pay | Admitting: *Deleted

## 2013-01-10 ENCOUNTER — Other Ambulatory Visit: Payer: Self-pay

## 2013-01-16 ENCOUNTER — Other Ambulatory Visit: Payer: Self-pay | Admitting: Family Medicine

## 2013-01-16 LAB — POTASSIUM: Potassium: 5.2 mEq/L (ref 3.5–5.3)

## 2013-01-28 ENCOUNTER — Other Ambulatory Visit: Payer: Self-pay | Admitting: Family Medicine

## 2013-02-09 ENCOUNTER — Other Ambulatory Visit: Payer: Self-pay | Admitting: Family Medicine

## 2013-02-15 ENCOUNTER — Other Ambulatory Visit: Payer: Self-pay | Admitting: Family Medicine

## 2013-03-20 ENCOUNTER — Other Ambulatory Visit: Payer: Self-pay | Admitting: Family Medicine

## 2013-03-23 ENCOUNTER — Other Ambulatory Visit: Payer: Self-pay | Admitting: Family Medicine

## 2013-03-29 ENCOUNTER — Other Ambulatory Visit: Payer: Self-pay | Admitting: Family Medicine

## 2013-04-09 ENCOUNTER — Ambulatory Visit (INDEPENDENT_AMBULATORY_CARE_PROVIDER_SITE_OTHER): Payer: Medicare PPO | Admitting: Family Medicine

## 2013-04-09 ENCOUNTER — Encounter: Payer: Self-pay | Admitting: Family Medicine

## 2013-04-09 VITALS — BP 121/60 | HR 91 | Wt 148.0 lb

## 2013-04-09 DIAGNOSIS — I509 Heart failure, unspecified: Secondary | ICD-10-CM

## 2013-04-09 DIAGNOSIS — R918 Other nonspecific abnormal finding of lung field: Secondary | ICD-10-CM

## 2013-04-09 DIAGNOSIS — Z23 Encounter for immunization: Secondary | ICD-10-CM

## 2013-04-09 DIAGNOSIS — E1159 Type 2 diabetes mellitus with other circulatory complications: Secondary | ICD-10-CM

## 2013-04-09 NOTE — Progress Notes (Signed)
Subjective:    Patient ID: Zachary Rios, male    DOB: Dec 04, 1935, 77 y.o.   MRN: [id number]  HPI  DM- Hasn't been checking his sugar.  He has been eating ice cream a lot.  Dosen't eat on regular  Schedule.   Saw pulmonolgist and they found 3 spots on the left lung. He is scheduled for another scan scheduled for next Tuesday.    CHF - His lasix was stopped.  He denies any inc in SOB or recent onset or inc in swelling.   Review of Systems  BP 121/60  Pulse 91  Wt 148 lb (67.132 kg)  BMI 22.51 kg/m2    No Known Allergies  Past Medical History  Diagnosis Date  . Anxiety   . Hypertension   . Diabetes mellitus   . Hyperlipidemia   . Ulcer     peptic  . COPD (chronic obstructive pulmonary disease)   . Anemia   . PAD (peripheral artery disease)   . Cancer 2011    lung    Past Surgical History  Procedure Laterality Date  . Thoracotomy  02/2010    RUL resection for lung cancer  . Transurethral resection of prostate  03/15/12    Dr. Karoline Caldwell  . Pacemaker insertion  07/26/12    Dr. Sherren Kerns    History   Social History  . Marital Status: Married    Spouse Name: N/A    Number of Children: N/A  . Years of Education: N/A   Occupational History  . Retired     Social History Main Topics  . Smoking status: Former Smoker -- 1.00 packs/day    Types: Cigarettes    Start date: 10/03/2011  . Smokeless tobacco: Former Neurosurgeon  . Alcohol Use: No  . Drug Use: No  . Sexual Activity: Not on file   Other Topics Concern  . Not on file   Social History Narrative  . No narrative on file    Family History  Problem Relation Age of Onset  . Cancer Father   . Lung cancer Brother     lung    Outpatient Encounter Prescriptions as of 04/09/2013  Medication Sig Dispense Refill  . albuterol (PROAIR HFA) 108 (90 BASE) MCG/ACT inhaler Inhale 2 puffs into the lungs every 6 (six) hours as needed.        Marland Kitchen albuterol (PROVENTIL) (2.5 MG/3ML) 0.083% nebulizer solution Take 3 mLs  (2.5 mg total) by nebulization every 6 (six) hours as needed for wheezing.  90 vial  3  . ALPRAZolam (XANAX) 0.5 MG tablet       . AMBULATORY NON FORMULARY MEDICATION Medication Name: Glucometer adn strips and lancet to test once a day.  Dx 250.00  1 Units  11  . amitriptyline (ELAVIL) 75 MG tablet TAKE 1 TABLET AT BEDTIME.  30 tablet  2  . budesonide-formoterol (SYMBICORT) 160-4.5 MCG/ACT inhaler Inhale 2 puffs into the lungs 2 (two) times daily.  1 Inhaler  12  . DIGOX 0.125 MG tablet TAKE 1 TABLET ON MONDAY,WEDNESDAY, AND FRIDAY  15 tablet  3  . docusate sodium (COLACE) 100 MG capsule Take 100 mg by mouth 2 (two) times daily.        Marland Kitchen ipratropium-albuterol (DUONEB) 0.5-2.5 (3) MG/3ML SOLN Take 3 mLs by nebulization every 6 (six) hours as needed.  360 mL  11  . iron polysaccharides (POLY-IRON 150) 150 MG capsule Take 150 mg by mouth 2 (two) times daily.        Marland Kitchen  lisinopril (PRINIVIL,ZESTRIL) 5 MG tablet TAKE 1 TABLET AT BEDTIME  30 tablet  3  . metoprolol succinate (TOPROL-XL) 25 MG 24 hr tablet Take 12.5 mg by mouth daily.      . pantoprazole (PROTONIX) 40 MG tablet Take 1 tablet (40 mg total) by mouth daily.  30 tablet  4  . polyethylene glycol (MIRALAX / GLYCOLAX) packet Take 17 g by mouth daily.        . simvastatin (ZOCOR) 40 MG tablet Take 1 tablet (40 mg total) by mouth at bedtime.  30 tablet  2  . temazepam (RESTORIL) 15 MG capsule TAKE 1 CAPSULE AT BEDTIME  30 capsule  0  . terazosin (HYTRIN) 10 MG capsule Take 1 capsule (10 mg total) by mouth at bedtime.  90 capsule  1  . Triamcinolone Acetonide (TRIAMCINOLONE 0.1 % CREAM : EUCERIN) CREA Apply 1 application topically 2 (two) times daily.  1 each  1  . [DISCONTINUED] ALPRAZolam (XANAX) 0.5 MG tablet TAKE 1 TABLET AT BEDTIME AS NEEDED FOR SLEEP  90 tablet  0   No facility-administered encounter medications on file as of 04/09/2013.          Objective:   Physical Exam  Constitutional: He is oriented to person, place, and time. He  appears well-developed and well-nourished.  HENT:  Head: Normocephalic and atraumatic.  Neck: Neck supple. No thyromegaly present.  Cardiovascular: Normal rate, regular rhythm and normal heart sounds.   Pulmonary/Chest: Effort normal. He has wheezes.  Mild expiratory wheezing on the left side.   Musculoskeletal: He exhibits no edema.  Lymphadenopathy:    He has no cervical adenopathy.  Neurological: He is alert and oriented to person, place, and time.  Skin: Skin is warm and dry.  Psychiatric: He has a normal mood and affect. His behavior is normal.          Assessment & Plan:  DM - Uncontrolled. He is normally well controlled with diet.  Discussed adding med vs working on diet.  He has opted to work on diet and exercise and f/u in 3 months. Cut out ice cream. On ACE and statin. F/u in 3 mo.   Pulmonary nodules - Will follow along. It sounds like he may be set up for a PET scan but he is not sure    CHF - stable. No recent exacerbation. Sxs well controlled.  On ACE, BBB. Not currently on diuretic.

## 2013-04-23 ENCOUNTER — Other Ambulatory Visit: Payer: Self-pay | Admitting: Family Medicine

## 2013-05-15 ENCOUNTER — Other Ambulatory Visit: Payer: Self-pay | Admitting: Family Medicine

## 2013-05-21 ENCOUNTER — Telehealth: Payer: Self-pay | Admitting: *Deleted

## 2013-05-21 ENCOUNTER — Other Ambulatory Visit: Payer: Self-pay | Admitting: Family Medicine

## 2013-05-21 ENCOUNTER — Encounter: Payer: Self-pay | Admitting: Family Medicine

## 2013-05-21 ENCOUNTER — Ambulatory Visit (INDEPENDENT_AMBULATORY_CARE_PROVIDER_SITE_OTHER): Payer: Medicare PPO | Admitting: Family Medicine

## 2013-05-21 VITALS — BP 120/67 | HR 79

## 2013-05-21 DIAGNOSIS — E1159 Type 2 diabetes mellitus with other circulatory complications: Secondary | ICD-10-CM

## 2013-05-21 LAB — GLUCOSE, POCT (MANUAL RESULT ENTRY)

## 2013-05-21 MED ORDER — GLUCOSE BLOOD VI STRP: ORAL_STRIP | Status: AC

## 2013-05-21 MED ORDER — METFORMIN HCL 500 MG PO TABS: 500.0000 mg | ORAL_TABLET | Freq: Two times a day (BID) | ORAL | Status: DC

## 2013-05-21 MED ORDER — ONETOUCH ULTRASOFT LANCETS MISC: Status: AC

## 2013-05-21 NOTE — Telephone Encounter (Signed)
Pt's daughter from [facility] called and stated that she had spoken to pt's sister Zachary Rios. And she told her that pt's BS was 594 about 1 hour ago and that he has p/u his meds this was around 12 or 1230 had taken the new med and is feeling lightheaded and sleepy and he has been sleep for about 20-30 mins wanted to know if he should be sleeping or should they take him to the ed. She also wanted to know if Dr. Linford Arnold would refer him to hospice I told her that this would be something that his oncologist would need to take care of not that we were putting him off however he would need to start this process. She voiced understanding and agreed.Loralee Pacas [place]

## 2013-05-21 NOTE — Progress Notes (Signed)
  Subjective:    Patient ID: Zachary Rios, male    DOB: 05-17-1936, 77 y.o.   MRN: [id number]  HPI pt had chemo tx on yesterday and prior to tx he is given a steroid injection he gets tx every monday for 3 weeks and off 1 wk. Didn't know sugar was thi high until this AM when oncology called.  He did pick up the metformin today.  We had called it in.  Feels ine today. duaghter says he seems like himself. He reports feeling asymptomatic. He has not had any recent infection such as upper respiratory. No abdominal pain. No dysuria. He did take a little nap this afternoon but otherwise has not felt disoriented. He's not been slurring his speech and feels fine. He says he always feels a little bit tired the day after his chemotherapy. Lab Results  Component Value Date   CREATININE 1.8* 12/13/2012     Checked sugar at sisters house and it was 570s.    Review of Systems     Objective:   Physical Exam  Constitutional: He is oriented to person, place, and time. He appears well-developed and well-nourished.  HENT:  Head: Normocephalic and atraumatic.  Eyes: Conjunctivae are normal.  Neurological: He is alert and oriented to person, place, and time.  Skin: No rash noted.  Psychiatric: He has a normal mood and affect. His behavior is normal.          Assessment & Plan:  DM- uncontrolled, likely secondary to prednisone given with chemotherapy. He is asymptomatic and is not obtunded in any way so we will try to treat him a home. Start him on Lantus. Given 12 units here in the office and instructed him to start her on how to use the Lantus pen device. Given a prescription for lancets and pen needles. Followup in 2 weeks. Gave instructions on how to titrate the Lantus over the next few days. Hopefully his sugars will come down fairly quickly especially as the steroid that you received during chemotherapy yesterday because to wear off. He typically gets his chemotherapy on Mondays. His are can call she has  any questions or problems. If she starts to notice any changes such as mental status changes or any sign of infection such as fever then she is to call back immediately. Bring in glucose log. Given samples of lantus and given rx for gluocometer and strips and lancets.

## 2013-05-21 NOTE — Patient Instructions (Addendum)
Given 12 units at bedtime if sugar in the morning is over 130.  If sugar running greater than 200 in the morning then increase by 2 units each day.   Hold the lantus if sugar in the morning is under 130.

## 2013-05-22 ENCOUNTER — Telehealth: Payer: Self-pay | Admitting: Family Medicine

## 2013-05-22 ENCOUNTER — Other Ambulatory Visit: Payer: Self-pay | Admitting: Family Medicine

## 2013-05-22 ENCOUNTER — Ambulatory Visit: Payer: Medicare PPO | Admitting: Family Medicine

## 2013-05-22 NOTE — Telephone Encounter (Signed)
Called pt and spoke w/his sister Kathie Rhodes she stated that he is not taking this medication.  I also lvm on pt's daughter's phone informing her to make sure that he has STOPPED metformin. And that he is only to take the insulin.Loralee Pacas [place]

## 2013-05-22 NOTE — Telephone Encounter (Signed)
Please call pt and remind him not to take the metformin.  I told her during the OV but forgot to take it off his med list. Want him just on the insuln for his sugars.

## 2013-05-27 ENCOUNTER — Other Ambulatory Visit: Payer: Self-pay | Admitting: Family Medicine

## 2013-06-06 ENCOUNTER — Ambulatory Visit (INDEPENDENT_AMBULATORY_CARE_PROVIDER_SITE_OTHER): Payer: Medicare PPO | Admitting: Family Medicine

## 2013-06-06 ENCOUNTER — Encounter: Payer: Self-pay | Admitting: Family Medicine

## 2013-06-06 VITALS — BP 126/63 | HR 117 | Temp 97.9°F | Wt 139.0 lb

## 2013-06-06 DIAGNOSIS — C3490 Malignant neoplasm of unspecified part of unspecified bronchus or lung: Secondary | ICD-10-CM

## 2013-06-06 DIAGNOSIS — R05 Cough: Secondary | ICD-10-CM

## 2013-06-06 DIAGNOSIS — I809 Phlebitis and thrombophlebitis of unspecified site: Secondary | ICD-10-CM

## 2013-06-06 DIAGNOSIS — E1159 Type 2 diabetes mellitus with other circulatory complications: Secondary | ICD-10-CM

## 2013-06-06 DIAGNOSIS — J209 Acute bronchitis, unspecified: Secondary | ICD-10-CM

## 2013-06-06 DIAGNOSIS — J441 Chronic obstructive pulmonary disease with (acute) exacerbation: Secondary | ICD-10-CM

## 2013-06-06 LAB — POCT CBG (FASTING - GLUCOSE)-MANUAL ENTRY: Glucose Fasting, POC: 344 mg/dL — AB (ref 70–99)

## 2013-06-06 MED ORDER — IPRATROPIUM-ALBUTEROL 0.5-2.5 (3) MG/3ML IN SOLN
3.0000 mL | Freq: Once | RESPIRATORY_TRACT | Status: AC
  Administered 2013-06-06: 3 mL via RESPIRATORY_TRACT

## 2013-06-06 MED ORDER — ALPRAZOLAM 0.5 MG PO TABS: 0.5000 mg | ORAL_TABLET | Freq: Two times a day (BID) | ORAL | Status: AC | PRN

## 2013-06-06 MED ORDER — LEVOFLOXACIN 500 MG PO TABS: 500.0000 mg | ORAL_TABLET | Freq: Every day | ORAL | Status: AC

## 2013-06-06 MED ORDER — PREDNISONE 20 MG PO TABS: 40.0000 mg | ORAL_TABLET | Freq: Every day | ORAL | Status: DC

## 2013-06-06 NOTE — Patient Instructions (Addendum)
Give Lantus 22 units each night If sugar under 130, then only give 12 units at night.   If sugar is under 80 then don't give insulin that night.    Ok to take the oxycodone every 4 to 6 hours as needed for pain  Take Aleve daily for next 1-2 weeks or Aspirin for the inflammed blood vessels in your arm.  Keep covered with ACE wrap daily for one week.  Also apply warm moist heat 3 times a day for 10-15 minutes for the next week  Take the levaquin once a day for your lung infection (bronchitis) and the prednisone daily Doing your albuterol breathing treatment every 4 hours during the daytime.

## 2013-06-06 NOTE — Progress Notes (Signed)
   Subjective:    Patient ID: Zachary Rios, male    DOB: February 09, 1936, 77 y.o.   MRN: [id number]  HPI 2 days of nasal and chest congestion. Cough is worse at night cough is wett. No fever or chills or sweats.  +nasal congestion. No cold medication. No diarrhea.  Some more SOB. No swelling. Sister who is here with him today has similar symptoms. He has been wheezing. He has not been using his albuterol. He has been using his Symbicort.  He had decided to Stop chemothreapy. He has done 3 treatments and feels that she has not tolerated it well. He says he doesn't feels well and has been driving for sugars up. In fact we had to start him on insulin because of this. He has decided to stop his chemotherapy after 3 treatments. The treatments were strictly palliative.  He has an inflamed blood vessel on his right forearm that he would like me to look at today. He said he was given some pain medication. He has not been doing any other treatments on it.  Review of Systems     Objective:   Physical Exam  Constitutional: He is oriented to person, place, and time. He appears well-developed and well-nourished.  HENT:  Head: Normocephalic and atraumatic.  Right Ear: External ear normal.  Left Ear: External ear normal.  Nose: Nose normal.  Mouth/Throat: Oropharynx is clear and moist.  TMs and canals are clear.   Eyes: Conjunctivae and EOM are normal. Pupils are equal, round, and reactive to light.  Neck: Neck supple. No thyromegaly present.  Cardiovascular: Normal rate and normal heart sounds.   Pulmonary/Chest: Effort normal and breath sounds normal.  Lymphadenopathy:    He has no cervical adenopathy.  Neurological: He is alert and oriented to person, place, and time.  Skin: Skin is warm and dry.  Psychiatric: He has a normal mood and affect. His behavior is normal. Judgment and thought content normal.          Assessment & Plan:  Acute bronchitis/COPD exacerbation-will treat with Levaquin and  prednisone burst for 5 days. If she's not getting better suddenly gets worse then please call the office back. Make sure staying well hydrated. He does have a nebulizer machine at home encouraged him to use it every 4 hours during the day for the next few days and placed on better and then can wean off as tolerated.  Thrombophlebitis-treat with warm moist compresses and light Ace wrap. Recommend a blood thinner such as an anti-inflammatory or even aspirin if that's what you're he has a home. He will need to treat this for one to 2 weeks her to completely resolve. She feels it's getting worse or seeing any redness concerning for possible cellulitis or infection then please call the office back.  Lung cancer-he is decided to stop his palliative chemotherapy. He has bilateral nodules. He has great family support. And says he is more interested in having quality of life rather than length of life.

## 2013-06-09 ENCOUNTER — Other Ambulatory Visit: Payer: Self-pay | Admitting: Family Medicine

## 2013-06-11 ENCOUNTER — Other Ambulatory Visit: Payer: Self-pay | Admitting: *Deleted

## 2013-06-11 ENCOUNTER — Other Ambulatory Visit: Payer: Self-pay | Admitting: Family Medicine

## 2013-06-11 MED ORDER — INSULIN GLARGINE 100 UNIT/ML SOLOSTAR PEN: PEN_INJECTOR | SUBCUTANEOUS | Status: AC

## 2013-06-17 ENCOUNTER — Telehealth: Payer: Self-pay | Admitting: *Deleted

## 2013-06-17 NOTE — Telephone Encounter (Signed)
Called pt and he stated that he has taken all of the abx. I told him to drink some boost or ensure and try to get his bp checked. He stated that he does not have a bp monitor. I called and informed his daughter Purnell Shoemaker) and she states that the pt has been drinking the supplemental drinks and they will get more.Loralee Pacas [place]

## 2013-06-17 NOTE — Telephone Encounter (Signed)
Has he taken his last pill? If so then can see how doing over the next 1-2 days. Try to get him to drink some boost or ensure for extra calories.  Also see if they can check his blood pressure?

## 2013-06-17 NOTE — Telephone Encounter (Signed)
Pt's daughter called and stated that her sister informed her that her dad has been really shaky and feels that it is coming from the abx. I informed her that it may be a combination of the meds and the fact that he is so weak right now from the chemo and not eating well. I will forward to pcp for advice.Loralee Pacas [place]

## 2013-06-20 ENCOUNTER — Other Ambulatory Visit: Payer: Self-pay | Admitting: Family Medicine

## 2013-07-10 ENCOUNTER — Encounter: Payer: Self-pay | Admitting: Family Medicine

## 2013-07-10 ENCOUNTER — Ambulatory Visit (INDEPENDENT_AMBULATORY_CARE_PROVIDER_SITE_OTHER): Payer: Medicare HMO | Admitting: Family Medicine

## 2013-07-10 VITALS — BP 98/49 | HR 86 | Temp 97.7°F | Wt 138.0 lb

## 2013-07-10 DIAGNOSIS — C3490 Malignant neoplasm of unspecified part of unspecified bronchus or lung: Secondary | ICD-10-CM

## 2013-07-10 DIAGNOSIS — E1159 Type 2 diabetes mellitus with other circulatory complications: Secondary | ICD-10-CM

## 2013-07-10 DIAGNOSIS — I10 Essential (primary) hypertension: Secondary | ICD-10-CM

## 2013-07-10 LAB — POCT GLYCOSYLATED HEMOGLOBIN (HGB A1C): Hemoglobin A1C: 8.9

## 2013-07-10 NOTE — Progress Notes (Signed)
   Subjective:    Patient ID: Zachary Rios, male    DOB: [birth year], 78 y.o.   MRN: [id number]  HPI He started doing chem again. He says his sugar goes up to 500-600 after chem.  Says the insulin seems to bring it back down. He says within 3 or 4 hours after no straining insulin his sugars will usually come down. He said last night he was running in the 300 range and after getting 12 units of insulin his sugar this morning was 94.   Hypertension- Pt denies chest pain, SOB, dizziness, or heart palpitations.  Taking meds as directed w/o problems.  Denies medication side effects.  His blood pressures actually little bit low today. He denies feeling lightheaded or dizzy.  He reports his pressure was around 354 systolic a couple of days ago.     Lung cancer-currently undergoing chemotherapy again. He did decide to restart it. He really misses going to church but has been encouraged to stay away from crowds. Fortunately he has not been ill over the holidays.  Review of Systems     Objective:   Physical Exam  Constitutional: He is oriented to person, place, and time. He appears well-developed and well-nourished.  HENT:  Head: Normocephalic and atraumatic.  Cardiovascular: Normal rate, regular rhythm and normal heart sounds.   Pulmonary/Chest: Effort normal and breath sounds normal.  Neurological: He is alert and oriented to person, place, and time.  Skin: Skin is warm and dry.  Psychiatric: He has a normal mood and affect. His behavior is normal.          Assessment & Plan:  HTN - hold lisinopril for now.  He is asymptomatic but I am concerned that his blood pressure is as low as it is today. So we'll just hold the lisinopril for now. Continue half a tab of the metoprolol. Work on staying hydrated and increasing fluid intake. Followup in 3 months. He'll continue to monitor his blood pressure when he goes for chemotherapy weekly.  DM- Uncontrolled but secondary to chemo. He is getting a steroid  dose before his chemotherapy to prevent nausea.  I am worried about adjusting his regimen to the point of causing lows when he is inbetween chemotherapy. I asked him to call me back if he feels like the insulin is not bringing his sugars down pretty quickly. If this happens then we can certainly make some adjustments. Otherwise we'll continue his current regimen. Followup in 3 months.  Lung cancer-continue to chemotherapy. Even though I know church is very important to him I did encourage him to stay away for now especially with the amount of viral illnesses and through that we had seen here locally. He did get a flu vaccines this year.

## 2013-07-10 NOTE — Patient Instructions (Signed)
Hold your lisinopril until I see you back.

## 2013-08-04 DEATH — deceased

## 2013-10-09 ENCOUNTER — Ambulatory Visit: Payer: Medicare HMO | Admitting: Family Medicine
